# Patient Record
Sex: Female | Born: 1935 | Race: White | Hispanic: No | Smoking: Never smoker
Health system: Southern US, Community
[De-identification: ages and names within clinical notes are randomized; demographics above are authoritative.]

## PROBLEM LIST (undated history)

## (undated) DIAGNOSIS — E785 Hyperlipidemia, unspecified: Secondary | ICD-10-CM

## (undated) DIAGNOSIS — I10 Essential (primary) hypertension: Secondary | ICD-10-CM

## (undated) DIAGNOSIS — J449 Chronic obstructive pulmonary disease, unspecified: Secondary | ICD-10-CM

## (undated) DIAGNOSIS — F0390 Unspecified dementia without behavioral disturbance: Secondary | ICD-10-CM

---

## 2019-12-14 ENCOUNTER — Other Ambulatory Visit: Payer: Self-pay | Admitting: Internal Medicine

## 2019-12-14 ENCOUNTER — Other Ambulatory Visit: Payer: Self-pay

## 2019-12-14 DIAGNOSIS — R1312 Dysphagia, oropharyngeal phase: Secondary | ICD-10-CM

## 2019-12-28 ENCOUNTER — Ambulatory Visit
Admission: RE | Admit: 2019-12-28 | Discharge: 2019-12-28 | Disposition: A | Discharge status: Home / Self Care | Payer: Medicare Other | Source: Ambulatory Visit | Attending: Internal Medicine | Admitting: Internal Medicine

## 2019-12-28 ENCOUNTER — Other Ambulatory Visit: Payer: Self-pay

## 2019-12-28 DIAGNOSIS — R1312 Dysphagia, oropharyngeal phase: Secondary | ICD-10-CM | POA: Insufficient documentation

## 2019-12-28 NOTE — Therapy (Signed)
[place], [place], [address] Phone: [phone]   Fax:     Modified Barium Swallow  Patient Details  Name: Madeline Hernandez MRN: [id number] Date of Birth: [birth year] No data recorded  Encounter Date: 12/28/2019  End of Session - 12/28/19 1306    Visit Number  1    Number of Visits  1    Date for SLP Re-Evaluation  12/28/19       No past medical history on file.   There were no vitals filed for this visit.   Subjective: Patient behavior: (alertness, ability to follow instructions, etc.): The patient is alert and able to follow directions.   Chief complaint: Unknown- patient denies difficulty swallowing    Objective:  Radiological Procedure: A videoflouroscopic evaluation of oral-preparatory, reflex initiation, and pharyngeal phases of the swallow was performed; as well as a screening of the upper esophageal phase.  I. POSTURE: Upright in MBS chair  II. VIEW: Lateral  III. COMPENSATORY STRATEGIES: N/A  IV. BOLUSES ADMINISTERED:   Thin Liquid: 1 small, 3 rapid consecutive   Nectar-thick Liquid: 1 large   Honey-thick Liquid: DNT   Puree: 1 teaspoon, 1 heaping teaspoon   Mechanical Soft: 1/4 graham cracker in applesauce  V. RESULTS OF EVALUATION: A. ORAL PREPARATORY PHASE: (The lips, tongue, and velum are observed for strength and coordination)       **Overall Severity Rating: within normal limits  B. SWALLOW INITIATION/REFLEX: (The reflex is normal if "triggered" by the time the bolus reached the base of the tongue)  **Overall Severity Rating: Mild; triggers while falling from the valleculae to the pyriform sinuses  C. PHARYNGEAL PHASE: (Pharyngeal function is normal if the bolus shows rapid, smooth, and continuous transit through the pharynx and there is no pharyngeal residue after the swallow)  **Overall Severity Rating: within normal limits  D. LARYNGEAL PENETRATION: (Material  entering into the laryngeal inlet/vestibule but not aspirated) TRANSIENT laryngeal penetration of liquids (thin and nectar-thick) without laryngeal vestibule residue  E. ASPIRATION: None  F. ESOPHAGEAL PHASE: (Screening of the upper esophagus) In the cervical esophagus there is a finger-like protrusion along the posterior wall during swallow (does not impede flow of boluses) consistent with prominent cricopharyngeus.    ASSESSMENT: This 86 year old woman; sent for MBSS to determine oropharyngeal swallow function and rule out aspiration; is presenting with minimal oropharyngeal dysphagia characterized by delayed pharyngeal swallow initiation and transient laryngeal penetration. Oral control of the bolus including oral hold, rotary mastication, and anterior to posterior transfer is within functional limits.   Aspects of the pharyngeal stage of swallowing including tongue base retraction, hyolaryngeal excursion, epiglottic inversion, and duration/amplitude of UES opening are within normal limits.  There is no observed pharyngeal residue or tracheal aspiration.  The patient is not at risk for prandial aspiration.  In the cervical esophagus there is a finger-like protrusion along the posterior wall during swallow (does not impede flow of boluses) consistent with prominent cricopharyngeus.    PLAN/RECOMMENDATIONS:   A. Diet: Regular   B. Swallowing Precautions: Standard, no special precautions indicated   C. Recommended consultation to: GI   D. Therapy recommendations: speech therapy is not indicated   E. Results and recommendations were discussed with the patient immediately following the study and the final report routed to the referring practitioner      Oropharyngeal dysphagia - Plan: DG SWALLOW FUNC OP MEDICARE SPEECH PATH, [place]  Problem List There are no problems to display for this patient.  Leroy Sea, [place],  Susie 12/28/2019, 1:06 PM  [facility] [place], [place], [address] Phone: [phone]   Fax:     Name: Madeline Hernandez MRN: [id number] Date of Birth: 08-Apr-1933

## 2020-04-24 DIAGNOSIS — E559 Vitamin D deficiency, unspecified: Secondary | ICD-10-CM | POA: Diagnosis not present

## 2020-04-24 DIAGNOSIS — E038 Other specified hypothyroidism: Secondary | ICD-10-CM | POA: Diagnosis not present

## 2020-04-24 DIAGNOSIS — E119 Type 2 diabetes mellitus without complications: Secondary | ICD-10-CM | POA: Diagnosis not present

## 2020-04-24 DIAGNOSIS — D518 Other vitamin B12 deficiency anemias: Secondary | ICD-10-CM | POA: Diagnosis not present

## 2020-04-24 DIAGNOSIS — J449 Chronic obstructive pulmonary disease, unspecified: Secondary | ICD-10-CM | POA: Diagnosis not present

## 2020-05-04 DIAGNOSIS — B351 Tinea unguium: Secondary | ICD-10-CM | POA: Diagnosis not present

## 2020-05-14 DIAGNOSIS — J449 Chronic obstructive pulmonary disease, unspecified: Secondary | ICD-10-CM | POA: Diagnosis not present

## 2020-05-14 DIAGNOSIS — E039 Hypothyroidism, unspecified: Secondary | ICD-10-CM | POA: Diagnosis not present

## 2020-05-14 DIAGNOSIS — E785 Hyperlipidemia, unspecified: Secondary | ICD-10-CM | POA: Diagnosis not present

## 2020-05-14 DIAGNOSIS — I10 Essential (primary) hypertension: Secondary | ICD-10-CM | POA: Diagnosis not present

## 2020-05-18 DIAGNOSIS — E038 Other specified hypothyroidism: Secondary | ICD-10-CM | POA: Diagnosis not present

## 2020-05-18 DIAGNOSIS — D518 Other vitamin B12 deficiency anemias: Secondary | ICD-10-CM | POA: Diagnosis not present

## 2020-05-18 DIAGNOSIS — J449 Chronic obstructive pulmonary disease, unspecified: Secondary | ICD-10-CM | POA: Diagnosis not present

## 2020-05-18 DIAGNOSIS — E559 Vitamin D deficiency, unspecified: Secondary | ICD-10-CM | POA: Diagnosis not present

## 2020-05-18 DIAGNOSIS — E119 Type 2 diabetes mellitus without complications: Secondary | ICD-10-CM | POA: Diagnosis not present

## 2020-06-13 DIAGNOSIS — R590 Localized enlarged lymph nodes: Secondary | ICD-10-CM | POA: Diagnosis not present

## 2020-06-13 DIAGNOSIS — R0989 Other specified symptoms and signs involving the circulatory and respiratory systems: Secondary | ICD-10-CM | POA: Diagnosis not present

## 2020-06-13 DIAGNOSIS — I6523 Occlusion and stenosis of bilateral carotid arteries: Secondary | ICD-10-CM | POA: Diagnosis not present

## 2020-06-13 DIAGNOSIS — D492 Neoplasm of unspecified behavior of bone, soft tissue, and skin: Secondary | ICD-10-CM | POA: Diagnosis not present

## 2020-06-13 DIAGNOSIS — R229 Localized swelling, mass and lump, unspecified: Secondary | ICD-10-CM | POA: Diagnosis not present

## 2020-06-14 DIAGNOSIS — E785 Hyperlipidemia, unspecified: Secondary | ICD-10-CM | POA: Diagnosis not present

## 2020-06-14 DIAGNOSIS — J449 Chronic obstructive pulmonary disease, unspecified: Secondary | ICD-10-CM | POA: Diagnosis not present

## 2020-06-14 DIAGNOSIS — I10 Essential (primary) hypertension: Secondary | ICD-10-CM | POA: Diagnosis not present

## 2020-06-14 DIAGNOSIS — E039 Hypothyroidism, unspecified: Secondary | ICD-10-CM | POA: Diagnosis not present

## 2020-06-14 DIAGNOSIS — G4700 Insomnia, unspecified: Secondary | ICD-10-CM | POA: Diagnosis not present

## 2020-06-15 DIAGNOSIS — E042 Nontoxic multinodular goiter: Secondary | ICD-10-CM | POA: Diagnosis not present

## 2020-06-15 DIAGNOSIS — I700 Atherosclerosis of aorta: Secondary | ICD-10-CM | POA: Diagnosis not present

## 2020-06-15 DIAGNOSIS — R221 Localized swelling, mass and lump, neck: Secondary | ICD-10-CM | POA: Diagnosis not present

## 2020-06-15 DIAGNOSIS — I714 Abdominal aortic aneurysm, without rupture: Secondary | ICD-10-CM | POA: Diagnosis not present

## 2020-06-18 DIAGNOSIS — I70229 Atherosclerosis of native arteries of extremities with rest pain, unspecified extremity: Secondary | ICD-10-CM | POA: Diagnosis not present

## 2020-06-18 DIAGNOSIS — I739 Peripheral vascular disease, unspecified: Secondary | ICD-10-CM | POA: Diagnosis not present

## 2020-06-19 DIAGNOSIS — R011 Cardiac murmur, unspecified: Secondary | ICD-10-CM | POA: Diagnosis not present

## 2020-06-26 DIAGNOSIS — D518 Other vitamin B12 deficiency anemias: Secondary | ICD-10-CM | POA: Diagnosis not present

## 2020-06-26 DIAGNOSIS — E559 Vitamin D deficiency, unspecified: Secondary | ICD-10-CM | POA: Diagnosis not present

## 2020-06-26 DIAGNOSIS — E038 Other specified hypothyroidism: Secondary | ICD-10-CM | POA: Diagnosis not present

## 2020-06-26 DIAGNOSIS — E119 Type 2 diabetes mellitus without complications: Secondary | ICD-10-CM | POA: Diagnosis not present

## 2020-06-26 DIAGNOSIS — J449 Chronic obstructive pulmonary disease, unspecified: Secondary | ICD-10-CM | POA: Diagnosis not present

## 2020-07-03 DIAGNOSIS — Z79899 Other long term (current) drug therapy: Secondary | ICD-10-CM | POA: Diagnosis not present

## 2020-07-03 DIAGNOSIS — D518 Other vitamin B12 deficiency anemias: Secondary | ICD-10-CM | POA: Diagnosis not present

## 2020-07-03 DIAGNOSIS — E559 Vitamin D deficiency, unspecified: Secondary | ICD-10-CM | POA: Diagnosis not present

## 2020-07-03 DIAGNOSIS — E038 Other specified hypothyroidism: Secondary | ICD-10-CM | POA: Diagnosis not present

## 2020-07-03 DIAGNOSIS — E119 Type 2 diabetes mellitus without complications: Secondary | ICD-10-CM | POA: Diagnosis not present

## 2020-07-18 DIAGNOSIS — U071 COVID-19: Secondary | ICD-10-CM | POA: Diagnosis not present

## 2020-07-19 DIAGNOSIS — U071 COVID-19: Secondary | ICD-10-CM | POA: Diagnosis not present

## 2020-07-19 DIAGNOSIS — I10 Essential (primary) hypertension: Secondary | ICD-10-CM | POA: Diagnosis not present

## 2020-07-19 DIAGNOSIS — E039 Hypothyroidism, unspecified: Secondary | ICD-10-CM | POA: Diagnosis not present

## 2020-07-19 DIAGNOSIS — J449 Chronic obstructive pulmonary disease, unspecified: Secondary | ICD-10-CM | POA: Diagnosis not present

## 2020-07-26 DIAGNOSIS — J449 Chronic obstructive pulmonary disease, unspecified: Secondary | ICD-10-CM | POA: Diagnosis not present

## 2020-07-26 DIAGNOSIS — E119 Type 2 diabetes mellitus without complications: Secondary | ICD-10-CM | POA: Diagnosis not present

## 2020-07-26 DIAGNOSIS — E038 Other specified hypothyroidism: Secondary | ICD-10-CM | POA: Diagnosis not present

## 2020-07-26 DIAGNOSIS — E559 Vitamin D deficiency, unspecified: Secondary | ICD-10-CM | POA: Diagnosis not present

## 2020-07-26 DIAGNOSIS — D518 Other vitamin B12 deficiency anemias: Secondary | ICD-10-CM | POA: Diagnosis not present

## 2020-07-27 DIAGNOSIS — B351 Tinea unguium: Secondary | ICD-10-CM | POA: Diagnosis not present

## 2020-08-16 DIAGNOSIS — U071 COVID-19: Secondary | ICD-10-CM | POA: Diagnosis not present

## 2020-08-23 DIAGNOSIS — G4700 Insomnia, unspecified: Secondary | ICD-10-CM | POA: Diagnosis not present

## 2020-08-23 DIAGNOSIS — J449 Chronic obstructive pulmonary disease, unspecified: Secondary | ICD-10-CM | POA: Diagnosis not present

## 2020-08-23 DIAGNOSIS — E039 Hypothyroidism, unspecified: Secondary | ICD-10-CM | POA: Diagnosis not present

## 2020-08-23 DIAGNOSIS — I10 Essential (primary) hypertension: Secondary | ICD-10-CM | POA: Diagnosis not present

## 2020-08-23 DIAGNOSIS — E785 Hyperlipidemia, unspecified: Secondary | ICD-10-CM | POA: Diagnosis not present

## 2020-08-25 DIAGNOSIS — R059 Cough, unspecified: Secondary | ICD-10-CM | POA: Diagnosis not present

## 2020-08-26 DIAGNOSIS — E039 Hypothyroidism, unspecified: Secondary | ICD-10-CM | POA: Diagnosis not present

## 2020-08-26 DIAGNOSIS — E119 Type 2 diabetes mellitus without complications: Secondary | ICD-10-CM | POA: Diagnosis not present

## 2020-08-26 DIAGNOSIS — E785 Hyperlipidemia, unspecified: Secondary | ICD-10-CM | POA: Diagnosis not present

## 2020-08-26 DIAGNOSIS — J449 Chronic obstructive pulmonary disease, unspecified: Secondary | ICD-10-CM | POA: Diagnosis not present

## 2020-08-26 DIAGNOSIS — E038 Other specified hypothyroidism: Secondary | ICD-10-CM | POA: Diagnosis not present

## 2020-08-26 DIAGNOSIS — D518 Other vitamin B12 deficiency anemias: Secondary | ICD-10-CM | POA: Diagnosis not present

## 2020-08-26 DIAGNOSIS — E559 Vitamin D deficiency, unspecified: Secondary | ICD-10-CM | POA: Diagnosis not present

## 2020-08-26 DIAGNOSIS — I10 Essential (primary) hypertension: Secondary | ICD-10-CM | POA: Diagnosis not present

## 2020-09-20 DIAGNOSIS — E039 Hypothyroidism, unspecified: Secondary | ICD-10-CM | POA: Diagnosis not present

## 2020-09-20 DIAGNOSIS — E785 Hyperlipidemia, unspecified: Secondary | ICD-10-CM | POA: Diagnosis not present

## 2020-09-20 DIAGNOSIS — G4700 Insomnia, unspecified: Secondary | ICD-10-CM | POA: Diagnosis not present

## 2020-09-20 DIAGNOSIS — J449 Chronic obstructive pulmonary disease, unspecified: Secondary | ICD-10-CM | POA: Diagnosis not present

## 2020-09-20 DIAGNOSIS — I10 Essential (primary) hypertension: Secondary | ICD-10-CM | POA: Diagnosis not present

## 2020-09-24 DIAGNOSIS — E038 Other specified hypothyroidism: Secondary | ICD-10-CM | POA: Diagnosis not present

## 2020-09-24 DIAGNOSIS — J449 Chronic obstructive pulmonary disease, unspecified: Secondary | ICD-10-CM | POA: Diagnosis not present

## 2020-09-24 DIAGNOSIS — D518 Other vitamin B12 deficiency anemias: Secondary | ICD-10-CM | POA: Diagnosis not present

## 2020-09-24 DIAGNOSIS — E785 Hyperlipidemia, unspecified: Secondary | ICD-10-CM | POA: Diagnosis not present

## 2020-09-24 DIAGNOSIS — E039 Hypothyroidism, unspecified: Secondary | ICD-10-CM | POA: Diagnosis not present

## 2020-09-24 DIAGNOSIS — E559 Vitamin D deficiency, unspecified: Secondary | ICD-10-CM | POA: Diagnosis not present

## 2020-09-24 DIAGNOSIS — I10 Essential (primary) hypertension: Secondary | ICD-10-CM | POA: Diagnosis not present

## 2020-09-24 DIAGNOSIS — E119 Type 2 diabetes mellitus without complications: Secondary | ICD-10-CM | POA: Diagnosis not present

## 2020-10-02 DIAGNOSIS — Z79899 Other long term (current) drug therapy: Secondary | ICD-10-CM | POA: Diagnosis not present

## 2020-10-02 DIAGNOSIS — E038 Other specified hypothyroidism: Secondary | ICD-10-CM | POA: Diagnosis not present

## 2020-10-02 DIAGNOSIS — E7849 Other hyperlipidemia: Secondary | ICD-10-CM | POA: Diagnosis not present

## 2020-10-02 DIAGNOSIS — D518 Other vitamin B12 deficiency anemias: Secondary | ICD-10-CM | POA: Diagnosis not present

## 2020-10-02 DIAGNOSIS — E119 Type 2 diabetes mellitus without complications: Secondary | ICD-10-CM | POA: Diagnosis not present

## 2020-10-02 DIAGNOSIS — E612 Magnesium deficiency: Secondary | ICD-10-CM | POA: Diagnosis not present

## 2020-10-02 DIAGNOSIS — E559 Vitamin D deficiency, unspecified: Secondary | ICD-10-CM | POA: Diagnosis not present

## 2020-10-18 DIAGNOSIS — E039 Hypothyroidism, unspecified: Secondary | ICD-10-CM | POA: Diagnosis not present

## 2020-10-18 DIAGNOSIS — E559 Vitamin D deficiency, unspecified: Secondary | ICD-10-CM | POA: Diagnosis not present

## 2020-10-18 DIAGNOSIS — J449 Chronic obstructive pulmonary disease, unspecified: Secondary | ICD-10-CM | POA: Diagnosis not present

## 2020-10-18 DIAGNOSIS — I10 Essential (primary) hypertension: Secondary | ICD-10-CM | POA: Diagnosis not present

## 2020-10-18 DIAGNOSIS — J309 Allergic rhinitis, unspecified: Secondary | ICD-10-CM | POA: Diagnosis not present

## 2020-10-21 DIAGNOSIS — E039 Hypothyroidism, unspecified: Secondary | ICD-10-CM | POA: Diagnosis not present

## 2020-10-21 DIAGNOSIS — E559 Vitamin D deficiency, unspecified: Secondary | ICD-10-CM | POA: Diagnosis not present

## 2020-10-21 DIAGNOSIS — J449 Chronic obstructive pulmonary disease, unspecified: Secondary | ICD-10-CM | POA: Diagnosis not present

## 2020-10-21 DIAGNOSIS — I10 Essential (primary) hypertension: Secondary | ICD-10-CM | POA: Diagnosis not present

## 2020-10-21 DIAGNOSIS — E119 Type 2 diabetes mellitus without complications: Secondary | ICD-10-CM | POA: Diagnosis not present

## 2020-10-21 DIAGNOSIS — D518 Other vitamin B12 deficiency anemias: Secondary | ICD-10-CM | POA: Diagnosis not present

## 2020-10-21 DIAGNOSIS — E038 Other specified hypothyroidism: Secondary | ICD-10-CM | POA: Diagnosis not present

## 2020-10-21 DIAGNOSIS — E785 Hyperlipidemia, unspecified: Secondary | ICD-10-CM | POA: Diagnosis not present

## 2020-11-06 DIAGNOSIS — R262 Difficulty in walking, not elsewhere classified: Secondary | ICD-10-CM | POA: Diagnosis not present

## 2020-11-06 DIAGNOSIS — J309 Allergic rhinitis, unspecified: Secondary | ICD-10-CM | POA: Diagnosis not present

## 2020-11-06 DIAGNOSIS — E039 Hypothyroidism, unspecified: Secondary | ICD-10-CM | POA: Diagnosis not present

## 2020-11-06 DIAGNOSIS — F32A Depression, unspecified: Secondary | ICD-10-CM | POA: Diagnosis not present

## 2020-11-06 DIAGNOSIS — R5381 Other malaise: Secondary | ICD-10-CM | POA: Diagnosis not present

## 2020-11-06 DIAGNOSIS — G4700 Insomnia, unspecified: Secondary | ICD-10-CM | POA: Diagnosis not present

## 2020-11-06 DIAGNOSIS — J449 Chronic obstructive pulmonary disease, unspecified: Secondary | ICD-10-CM | POA: Diagnosis not present

## 2020-11-06 DIAGNOSIS — I10 Essential (primary) hypertension: Secondary | ICD-10-CM | POA: Diagnosis not present

## 2020-11-06 DIAGNOSIS — E785 Hyperlipidemia, unspecified: Secondary | ICD-10-CM | POA: Diagnosis not present

## 2020-11-06 DIAGNOSIS — R42 Dizziness and giddiness: Secondary | ICD-10-CM | POA: Diagnosis not present

## 2020-11-08 DIAGNOSIS — R42 Dizziness and giddiness: Secondary | ICD-10-CM | POA: Diagnosis not present

## 2020-11-08 DIAGNOSIS — R262 Difficulty in walking, not elsewhere classified: Secondary | ICD-10-CM | POA: Diagnosis not present

## 2020-11-08 DIAGNOSIS — I10 Essential (primary) hypertension: Secondary | ICD-10-CM | POA: Diagnosis not present

## 2020-11-08 DIAGNOSIS — I69318 Other symptoms and signs involving cognitive functions following cerebral infarction: Secondary | ICD-10-CM | POA: Diagnosis not present

## 2020-11-08 DIAGNOSIS — E785 Hyperlipidemia, unspecified: Secondary | ICD-10-CM | POA: Diagnosis not present

## 2020-11-08 DIAGNOSIS — J449 Chronic obstructive pulmonary disease, unspecified: Secondary | ICD-10-CM | POA: Diagnosis not present

## 2020-11-08 DIAGNOSIS — R531 Weakness: Secondary | ICD-10-CM | POA: Diagnosis not present

## 2020-11-08 DIAGNOSIS — E039 Hypothyroidism, unspecified: Secondary | ICD-10-CM | POA: Diagnosis not present

## 2020-11-08 DIAGNOSIS — M810 Age-related osteoporosis without current pathological fracture: Secondary | ICD-10-CM | POA: Diagnosis not present

## 2020-11-14 DIAGNOSIS — I69318 Other symptoms and signs involving cognitive functions following cerebral infarction: Secondary | ICD-10-CM | POA: Diagnosis not present

## 2020-11-14 DIAGNOSIS — I10 Essential (primary) hypertension: Secondary | ICD-10-CM | POA: Diagnosis not present

## 2020-11-14 DIAGNOSIS — R262 Difficulty in walking, not elsewhere classified: Secondary | ICD-10-CM | POA: Diagnosis not present

## 2020-11-14 DIAGNOSIS — E785 Hyperlipidemia, unspecified: Secondary | ICD-10-CM | POA: Diagnosis not present

## 2020-11-14 DIAGNOSIS — E039 Hypothyroidism, unspecified: Secondary | ICD-10-CM | POA: Diagnosis not present

## 2020-11-14 DIAGNOSIS — R531 Weakness: Secondary | ICD-10-CM | POA: Diagnosis not present

## 2020-11-14 DIAGNOSIS — J449 Chronic obstructive pulmonary disease, unspecified: Secondary | ICD-10-CM | POA: Diagnosis not present

## 2020-11-14 DIAGNOSIS — M810 Age-related osteoporosis without current pathological fracture: Secondary | ICD-10-CM | POA: Diagnosis not present

## 2020-11-14 DIAGNOSIS — R42 Dizziness and giddiness: Secondary | ICD-10-CM | POA: Diagnosis not present

## 2020-11-15 DIAGNOSIS — E039 Hypothyroidism, unspecified: Secondary | ICD-10-CM | POA: Diagnosis not present

## 2020-11-15 DIAGNOSIS — R42 Dizziness and giddiness: Secondary | ICD-10-CM | POA: Diagnosis not present

## 2020-11-15 DIAGNOSIS — R262 Difficulty in walking, not elsewhere classified: Secondary | ICD-10-CM | POA: Diagnosis not present

## 2020-11-15 DIAGNOSIS — R531 Weakness: Secondary | ICD-10-CM | POA: Diagnosis not present

## 2020-11-15 DIAGNOSIS — J449 Chronic obstructive pulmonary disease, unspecified: Secondary | ICD-10-CM | POA: Diagnosis not present

## 2020-11-15 DIAGNOSIS — I69318 Other symptoms and signs involving cognitive functions following cerebral infarction: Secondary | ICD-10-CM | POA: Diagnosis not present

## 2020-11-15 DIAGNOSIS — M810 Age-related osteoporosis without current pathological fracture: Secondary | ICD-10-CM | POA: Diagnosis not present

## 2020-11-15 DIAGNOSIS — I10 Essential (primary) hypertension: Secondary | ICD-10-CM | POA: Diagnosis not present

## 2020-11-15 DIAGNOSIS — E785 Hyperlipidemia, unspecified: Secondary | ICD-10-CM | POA: Diagnosis not present

## 2020-11-16 DIAGNOSIS — I10 Essential (primary) hypertension: Secondary | ICD-10-CM | POA: Diagnosis not present

## 2020-11-16 DIAGNOSIS — E039 Hypothyroidism, unspecified: Secondary | ICD-10-CM | POA: Diagnosis not present

## 2020-11-16 DIAGNOSIS — I69318 Other symptoms and signs involving cognitive functions following cerebral infarction: Secondary | ICD-10-CM | POA: Diagnosis not present

## 2020-11-16 DIAGNOSIS — E785 Hyperlipidemia, unspecified: Secondary | ICD-10-CM | POA: Diagnosis not present

## 2020-11-16 DIAGNOSIS — J449 Chronic obstructive pulmonary disease, unspecified: Secondary | ICD-10-CM | POA: Diagnosis not present

## 2020-11-16 DIAGNOSIS — M810 Age-related osteoporosis without current pathological fracture: Secondary | ICD-10-CM | POA: Diagnosis not present

## 2020-11-16 DIAGNOSIS — R42 Dizziness and giddiness: Secondary | ICD-10-CM | POA: Diagnosis not present

## 2020-11-16 DIAGNOSIS — R531 Weakness: Secondary | ICD-10-CM | POA: Diagnosis not present

## 2020-11-16 DIAGNOSIS — R262 Difficulty in walking, not elsewhere classified: Secondary | ICD-10-CM | POA: Diagnosis not present

## 2020-11-19 DIAGNOSIS — E785 Hyperlipidemia, unspecified: Secondary | ICD-10-CM | POA: Diagnosis not present

## 2020-11-19 DIAGNOSIS — R42 Dizziness and giddiness: Secondary | ICD-10-CM | POA: Diagnosis not present

## 2020-11-19 DIAGNOSIS — I69318 Other symptoms and signs involving cognitive functions following cerebral infarction: Secondary | ICD-10-CM | POA: Diagnosis not present

## 2020-11-19 DIAGNOSIS — M810 Age-related osteoporosis without current pathological fracture: Secondary | ICD-10-CM | POA: Diagnosis not present

## 2020-11-19 DIAGNOSIS — R531 Weakness: Secondary | ICD-10-CM | POA: Diagnosis not present

## 2020-11-19 DIAGNOSIS — R262 Difficulty in walking, not elsewhere classified: Secondary | ICD-10-CM | POA: Diagnosis not present

## 2020-11-19 DIAGNOSIS — I10 Essential (primary) hypertension: Secondary | ICD-10-CM | POA: Diagnosis not present

## 2020-11-19 DIAGNOSIS — E039 Hypothyroidism, unspecified: Secondary | ICD-10-CM | POA: Diagnosis not present

## 2020-11-19 DIAGNOSIS — J449 Chronic obstructive pulmonary disease, unspecified: Secondary | ICD-10-CM | POA: Diagnosis not present

## 2020-11-21 DIAGNOSIS — R531 Weakness: Secondary | ICD-10-CM | POA: Diagnosis not present

## 2020-11-21 DIAGNOSIS — I10 Essential (primary) hypertension: Secondary | ICD-10-CM | POA: Diagnosis not present

## 2020-11-21 DIAGNOSIS — E039 Hypothyroidism, unspecified: Secondary | ICD-10-CM | POA: Diagnosis not present

## 2020-11-21 DIAGNOSIS — E785 Hyperlipidemia, unspecified: Secondary | ICD-10-CM | POA: Diagnosis not present

## 2020-11-21 DIAGNOSIS — J449 Chronic obstructive pulmonary disease, unspecified: Secondary | ICD-10-CM | POA: Diagnosis not present

## 2020-11-21 DIAGNOSIS — I69318 Other symptoms and signs involving cognitive functions following cerebral infarction: Secondary | ICD-10-CM | POA: Diagnosis not present

## 2020-11-21 DIAGNOSIS — R262 Difficulty in walking, not elsewhere classified: Secondary | ICD-10-CM | POA: Diagnosis not present

## 2020-11-21 DIAGNOSIS — M810 Age-related osteoporosis without current pathological fracture: Secondary | ICD-10-CM | POA: Diagnosis not present

## 2020-11-21 DIAGNOSIS — R42 Dizziness and giddiness: Secondary | ICD-10-CM | POA: Diagnosis not present

## 2020-11-22 DIAGNOSIS — I69318 Other symptoms and signs involving cognitive functions following cerebral infarction: Secondary | ICD-10-CM | POA: Diagnosis not present

## 2020-11-22 DIAGNOSIS — R262 Difficulty in walking, not elsewhere classified: Secondary | ICD-10-CM | POA: Diagnosis not present

## 2020-11-22 DIAGNOSIS — M810 Age-related osteoporosis without current pathological fracture: Secondary | ICD-10-CM | POA: Diagnosis not present

## 2020-11-22 DIAGNOSIS — R42 Dizziness and giddiness: Secondary | ICD-10-CM | POA: Diagnosis not present

## 2020-11-22 DIAGNOSIS — E039 Hypothyroidism, unspecified: Secondary | ICD-10-CM | POA: Diagnosis not present

## 2020-11-22 DIAGNOSIS — E785 Hyperlipidemia, unspecified: Secondary | ICD-10-CM | POA: Diagnosis not present

## 2020-11-22 DIAGNOSIS — R531 Weakness: Secondary | ICD-10-CM | POA: Diagnosis not present

## 2020-11-22 DIAGNOSIS — J449 Chronic obstructive pulmonary disease, unspecified: Secondary | ICD-10-CM | POA: Diagnosis not present

## 2020-11-22 DIAGNOSIS — I10 Essential (primary) hypertension: Secondary | ICD-10-CM | POA: Diagnosis not present

## 2020-11-28 DIAGNOSIS — I69318 Other symptoms and signs involving cognitive functions following cerebral infarction: Secondary | ICD-10-CM | POA: Diagnosis not present

## 2020-11-28 DIAGNOSIS — R42 Dizziness and giddiness: Secondary | ICD-10-CM | POA: Diagnosis not present

## 2020-11-28 DIAGNOSIS — E785 Hyperlipidemia, unspecified: Secondary | ICD-10-CM | POA: Diagnosis not present

## 2020-11-28 DIAGNOSIS — E039 Hypothyroidism, unspecified: Secondary | ICD-10-CM | POA: Diagnosis not present

## 2020-11-28 DIAGNOSIS — E038 Other specified hypothyroidism: Secondary | ICD-10-CM | POA: Diagnosis not present

## 2020-11-28 DIAGNOSIS — M810 Age-related osteoporosis without current pathological fracture: Secondary | ICD-10-CM | POA: Diagnosis not present

## 2020-11-28 DIAGNOSIS — I10 Essential (primary) hypertension: Secondary | ICD-10-CM | POA: Diagnosis not present

## 2020-11-28 DIAGNOSIS — J449 Chronic obstructive pulmonary disease, unspecified: Secondary | ICD-10-CM | POA: Diagnosis not present

## 2020-11-28 DIAGNOSIS — R531 Weakness: Secondary | ICD-10-CM | POA: Diagnosis not present

## 2020-11-28 DIAGNOSIS — R262 Difficulty in walking, not elsewhere classified: Secondary | ICD-10-CM | POA: Diagnosis not present

## 2020-11-28 DIAGNOSIS — E119 Type 2 diabetes mellitus without complications: Secondary | ICD-10-CM | POA: Diagnosis not present

## 2020-11-28 DIAGNOSIS — E559 Vitamin D deficiency, unspecified: Secondary | ICD-10-CM | POA: Diagnosis not present

## 2020-11-28 DIAGNOSIS — D518 Other vitamin B12 deficiency anemias: Secondary | ICD-10-CM | POA: Diagnosis not present

## 2020-12-04 DIAGNOSIS — J309 Allergic rhinitis, unspecified: Secondary | ICD-10-CM | POA: Diagnosis not present

## 2020-12-04 DIAGNOSIS — R262 Difficulty in walking, not elsewhere classified: Secondary | ICD-10-CM | POA: Diagnosis not present

## 2020-12-04 DIAGNOSIS — E559 Vitamin D deficiency, unspecified: Secondary | ICD-10-CM | POA: Diagnosis not present

## 2020-12-04 DIAGNOSIS — E785 Hyperlipidemia, unspecified: Secondary | ICD-10-CM | POA: Diagnosis not present

## 2020-12-04 DIAGNOSIS — I10 Essential (primary) hypertension: Secondary | ICD-10-CM | POA: Diagnosis not present

## 2020-12-04 DIAGNOSIS — R5381 Other malaise: Secondary | ICD-10-CM | POA: Diagnosis not present

## 2020-12-04 DIAGNOSIS — J449 Chronic obstructive pulmonary disease, unspecified: Secondary | ICD-10-CM | POA: Diagnosis not present

## 2020-12-04 DIAGNOSIS — E039 Hypothyroidism, unspecified: Secondary | ICD-10-CM | POA: Diagnosis not present

## 2020-12-04 DIAGNOSIS — R42 Dizziness and giddiness: Secondary | ICD-10-CM | POA: Diagnosis not present

## 2020-12-04 DIAGNOSIS — G4700 Insomnia, unspecified: Secondary | ICD-10-CM | POA: Diagnosis not present

## 2020-12-24 DIAGNOSIS — E038 Other specified hypothyroidism: Secondary | ICD-10-CM | POA: Diagnosis not present

## 2020-12-24 DIAGNOSIS — D518 Other vitamin B12 deficiency anemias: Secondary | ICD-10-CM | POA: Diagnosis not present

## 2020-12-24 DIAGNOSIS — E559 Vitamin D deficiency, unspecified: Secondary | ICD-10-CM | POA: Diagnosis not present

## 2020-12-24 DIAGNOSIS — E119 Type 2 diabetes mellitus without complications: Secondary | ICD-10-CM | POA: Diagnosis not present

## 2020-12-24 DIAGNOSIS — I10 Essential (primary) hypertension: Secondary | ICD-10-CM | POA: Diagnosis not present

## 2020-12-24 DIAGNOSIS — E785 Hyperlipidemia, unspecified: Secondary | ICD-10-CM | POA: Diagnosis not present

## 2020-12-24 DIAGNOSIS — E039 Hypothyroidism, unspecified: Secondary | ICD-10-CM | POA: Diagnosis not present

## 2020-12-24 DIAGNOSIS — J449 Chronic obstructive pulmonary disease, unspecified: Secondary | ICD-10-CM | POA: Diagnosis not present

## 2021-01-08 DIAGNOSIS — I10 Essential (primary) hypertension: Secondary | ICD-10-CM | POA: Diagnosis not present

## 2021-01-08 DIAGNOSIS — J449 Chronic obstructive pulmonary disease, unspecified: Secondary | ICD-10-CM | POA: Diagnosis not present

## 2021-01-08 DIAGNOSIS — E785 Hyperlipidemia, unspecified: Secondary | ICD-10-CM | POA: Diagnosis not present

## 2021-01-08 DIAGNOSIS — R5381 Other malaise: Secondary | ICD-10-CM | POA: Diagnosis not present

## 2021-01-08 DIAGNOSIS — E039 Hypothyroidism, unspecified: Secondary | ICD-10-CM | POA: Diagnosis not present

## 2021-01-08 DIAGNOSIS — R262 Difficulty in walking, not elsewhere classified: Secondary | ICD-10-CM | POA: Diagnosis not present

## 2021-01-08 DIAGNOSIS — G4700 Insomnia, unspecified: Secondary | ICD-10-CM | POA: Diagnosis not present

## 2021-01-09 DIAGNOSIS — N390 Urinary tract infection, site not specified: Secondary | ICD-10-CM | POA: Diagnosis not present

## 2021-01-15 DIAGNOSIS — I10 Essential (primary) hypertension: Secondary | ICD-10-CM | POA: Diagnosis not present

## 2021-01-15 DIAGNOSIS — J449 Chronic obstructive pulmonary disease, unspecified: Secondary | ICD-10-CM | POA: Diagnosis not present

## 2021-01-15 DIAGNOSIS — E559 Vitamin D deficiency, unspecified: Secondary | ICD-10-CM | POA: Diagnosis not present

## 2021-01-15 DIAGNOSIS — F32A Depression, unspecified: Secondary | ICD-10-CM | POA: Diagnosis not present

## 2021-01-15 DIAGNOSIS — G4700 Insomnia, unspecified: Secondary | ICD-10-CM | POA: Diagnosis not present

## 2021-01-15 DIAGNOSIS — E785 Hyperlipidemia, unspecified: Secondary | ICD-10-CM | POA: Diagnosis not present

## 2021-01-22 DIAGNOSIS — F32A Depression, unspecified: Secondary | ICD-10-CM | POA: Diagnosis not present

## 2021-01-22 DIAGNOSIS — J449 Chronic obstructive pulmonary disease, unspecified: Secondary | ICD-10-CM | POA: Diagnosis not present

## 2021-01-22 DIAGNOSIS — E785 Hyperlipidemia, unspecified: Secondary | ICD-10-CM | POA: Diagnosis not present

## 2021-01-22 DIAGNOSIS — I69318 Other symptoms and signs involving cognitive functions following cerebral infarction: Secondary | ICD-10-CM | POA: Diagnosis not present

## 2021-01-22 DIAGNOSIS — I10 Essential (primary) hypertension: Secondary | ICD-10-CM | POA: Diagnosis not present

## 2021-01-22 DIAGNOSIS — E039 Hypothyroidism, unspecified: Secondary | ICD-10-CM | POA: Diagnosis not present

## 2021-01-22 DIAGNOSIS — M6281 Muscle weakness (generalized): Secondary | ICD-10-CM | POA: Diagnosis not present

## 2021-01-25 DIAGNOSIS — I69318 Other symptoms and signs involving cognitive functions following cerebral infarction: Secondary | ICD-10-CM | POA: Diagnosis not present

## 2021-01-25 DIAGNOSIS — E038 Other specified hypothyroidism: Secondary | ICD-10-CM | POA: Diagnosis not present

## 2021-01-25 DIAGNOSIS — E785 Hyperlipidemia, unspecified: Secondary | ICD-10-CM | POA: Diagnosis not present

## 2021-01-25 DIAGNOSIS — F32A Depression, unspecified: Secondary | ICD-10-CM | POA: Diagnosis not present

## 2021-01-25 DIAGNOSIS — E119 Type 2 diabetes mellitus without complications: Secondary | ICD-10-CM | POA: Diagnosis not present

## 2021-01-25 DIAGNOSIS — I10 Essential (primary) hypertension: Secondary | ICD-10-CM | POA: Diagnosis not present

## 2021-01-25 DIAGNOSIS — J449 Chronic obstructive pulmonary disease, unspecified: Secondary | ICD-10-CM | POA: Diagnosis not present

## 2021-01-25 DIAGNOSIS — E559 Vitamin D deficiency, unspecified: Secondary | ICD-10-CM | POA: Diagnosis not present

## 2021-01-25 DIAGNOSIS — D518 Other vitamin B12 deficiency anemias: Secondary | ICD-10-CM | POA: Diagnosis not present

## 2021-01-25 DIAGNOSIS — M6281 Muscle weakness (generalized): Secondary | ICD-10-CM | POA: Diagnosis not present

## 2021-01-25 DIAGNOSIS — E039 Hypothyroidism, unspecified: Secondary | ICD-10-CM | POA: Diagnosis not present

## 2021-01-28 DIAGNOSIS — I69318 Other symptoms and signs involving cognitive functions following cerebral infarction: Secondary | ICD-10-CM | POA: Diagnosis not present

## 2021-01-28 DIAGNOSIS — J449 Chronic obstructive pulmonary disease, unspecified: Secondary | ICD-10-CM | POA: Diagnosis not present

## 2021-01-28 DIAGNOSIS — I10 Essential (primary) hypertension: Secondary | ICD-10-CM | POA: Diagnosis not present

## 2021-01-28 DIAGNOSIS — E785 Hyperlipidemia, unspecified: Secondary | ICD-10-CM | POA: Diagnosis not present

## 2021-01-28 DIAGNOSIS — E039 Hypothyroidism, unspecified: Secondary | ICD-10-CM | POA: Diagnosis not present

## 2021-01-28 DIAGNOSIS — F32A Depression, unspecified: Secondary | ICD-10-CM | POA: Diagnosis not present

## 2021-01-28 DIAGNOSIS — M6281 Muscle weakness (generalized): Secondary | ICD-10-CM | POA: Diagnosis not present

## 2021-01-31 DIAGNOSIS — F32A Depression, unspecified: Secondary | ICD-10-CM | POA: Diagnosis not present

## 2021-01-31 DIAGNOSIS — I69318 Other symptoms and signs involving cognitive functions following cerebral infarction: Secondary | ICD-10-CM | POA: Diagnosis not present

## 2021-01-31 DIAGNOSIS — I10 Essential (primary) hypertension: Secondary | ICD-10-CM | POA: Diagnosis not present

## 2021-01-31 DIAGNOSIS — J449 Chronic obstructive pulmonary disease, unspecified: Secondary | ICD-10-CM | POA: Diagnosis not present

## 2021-01-31 DIAGNOSIS — M6281 Muscle weakness (generalized): Secondary | ICD-10-CM | POA: Diagnosis not present

## 2021-01-31 DIAGNOSIS — E039 Hypothyroidism, unspecified: Secondary | ICD-10-CM | POA: Diagnosis not present

## 2021-01-31 DIAGNOSIS — E785 Hyperlipidemia, unspecified: Secondary | ICD-10-CM | POA: Diagnosis not present

## 2021-02-01 DIAGNOSIS — I10 Essential (primary) hypertension: Secondary | ICD-10-CM | POA: Diagnosis not present

## 2021-02-01 DIAGNOSIS — E039 Hypothyroidism, unspecified: Secondary | ICD-10-CM | POA: Diagnosis not present

## 2021-02-01 DIAGNOSIS — M6281 Muscle weakness (generalized): Secondary | ICD-10-CM | POA: Diagnosis not present

## 2021-02-01 DIAGNOSIS — J449 Chronic obstructive pulmonary disease, unspecified: Secondary | ICD-10-CM | POA: Diagnosis not present

## 2021-02-01 DIAGNOSIS — F32A Depression, unspecified: Secondary | ICD-10-CM | POA: Diagnosis not present

## 2021-02-01 DIAGNOSIS — E785 Hyperlipidemia, unspecified: Secondary | ICD-10-CM | POA: Diagnosis not present

## 2021-02-01 DIAGNOSIS — I69318 Other symptoms and signs involving cognitive functions following cerebral infarction: Secondary | ICD-10-CM | POA: Diagnosis not present

## 2021-02-04 DIAGNOSIS — I69318 Other symptoms and signs involving cognitive functions following cerebral infarction: Secondary | ICD-10-CM | POA: Diagnosis not present

## 2021-02-04 DIAGNOSIS — E785 Hyperlipidemia, unspecified: Secondary | ICD-10-CM | POA: Diagnosis not present

## 2021-02-04 DIAGNOSIS — F32A Depression, unspecified: Secondary | ICD-10-CM | POA: Diagnosis not present

## 2021-02-04 DIAGNOSIS — I10 Essential (primary) hypertension: Secondary | ICD-10-CM | POA: Diagnosis not present

## 2021-02-04 DIAGNOSIS — E039 Hypothyroidism, unspecified: Secondary | ICD-10-CM | POA: Diagnosis not present

## 2021-02-04 DIAGNOSIS — M6281 Muscle weakness (generalized): Secondary | ICD-10-CM | POA: Diagnosis not present

## 2021-02-04 DIAGNOSIS — J449 Chronic obstructive pulmonary disease, unspecified: Secondary | ICD-10-CM | POA: Diagnosis not present

## 2021-02-06 DIAGNOSIS — M6281 Muscle weakness (generalized): Secondary | ICD-10-CM | POA: Diagnosis not present

## 2021-02-06 DIAGNOSIS — E039 Hypothyroidism, unspecified: Secondary | ICD-10-CM | POA: Diagnosis not present

## 2021-02-06 DIAGNOSIS — F32A Depression, unspecified: Secondary | ICD-10-CM | POA: Diagnosis not present

## 2021-02-06 DIAGNOSIS — E785 Hyperlipidemia, unspecified: Secondary | ICD-10-CM | POA: Diagnosis not present

## 2021-02-06 DIAGNOSIS — J449 Chronic obstructive pulmonary disease, unspecified: Secondary | ICD-10-CM | POA: Diagnosis not present

## 2021-02-06 DIAGNOSIS — I10 Essential (primary) hypertension: Secondary | ICD-10-CM | POA: Diagnosis not present

## 2021-02-06 DIAGNOSIS — I69318 Other symptoms and signs involving cognitive functions following cerebral infarction: Secondary | ICD-10-CM | POA: Diagnosis not present

## 2021-02-07 DIAGNOSIS — J449 Chronic obstructive pulmonary disease, unspecified: Secondary | ICD-10-CM | POA: Diagnosis not present

## 2021-02-07 DIAGNOSIS — F32A Depression, unspecified: Secondary | ICD-10-CM | POA: Diagnosis not present

## 2021-02-07 DIAGNOSIS — I10 Essential (primary) hypertension: Secondary | ICD-10-CM | POA: Diagnosis not present

## 2021-02-07 DIAGNOSIS — I69318 Other symptoms and signs involving cognitive functions following cerebral infarction: Secondary | ICD-10-CM | POA: Diagnosis not present

## 2021-02-07 DIAGNOSIS — M6281 Muscle weakness (generalized): Secondary | ICD-10-CM | POA: Diagnosis not present

## 2021-02-07 DIAGNOSIS — E039 Hypothyroidism, unspecified: Secondary | ICD-10-CM | POA: Diagnosis not present

## 2021-02-07 DIAGNOSIS — E785 Hyperlipidemia, unspecified: Secondary | ICD-10-CM | POA: Diagnosis not present

## 2021-02-11 DIAGNOSIS — I10 Essential (primary) hypertension: Secondary | ICD-10-CM | POA: Diagnosis not present

## 2021-02-11 DIAGNOSIS — I69318 Other symptoms and signs involving cognitive functions following cerebral infarction: Secondary | ICD-10-CM | POA: Diagnosis not present

## 2021-02-11 DIAGNOSIS — M6281 Muscle weakness (generalized): Secondary | ICD-10-CM | POA: Diagnosis not present

## 2021-02-11 DIAGNOSIS — J449 Chronic obstructive pulmonary disease, unspecified: Secondary | ICD-10-CM | POA: Diagnosis not present

## 2021-02-11 DIAGNOSIS — E039 Hypothyroidism, unspecified: Secondary | ICD-10-CM | POA: Diagnosis not present

## 2021-02-11 DIAGNOSIS — F32A Depression, unspecified: Secondary | ICD-10-CM | POA: Diagnosis not present

## 2021-02-11 DIAGNOSIS — E785 Hyperlipidemia, unspecified: Secondary | ICD-10-CM | POA: Diagnosis not present

## 2021-02-12 DIAGNOSIS — F32A Depression, unspecified: Secondary | ICD-10-CM | POA: Diagnosis not present

## 2021-02-12 DIAGNOSIS — E559 Vitamin D deficiency, unspecified: Secondary | ICD-10-CM | POA: Diagnosis not present

## 2021-02-12 DIAGNOSIS — G4700 Insomnia, unspecified: Secondary | ICD-10-CM | POA: Diagnosis not present

## 2021-02-12 DIAGNOSIS — E785 Hyperlipidemia, unspecified: Secondary | ICD-10-CM | POA: Diagnosis not present

## 2021-02-12 DIAGNOSIS — J449 Chronic obstructive pulmonary disease, unspecified: Secondary | ICD-10-CM | POA: Diagnosis not present

## 2021-02-12 DIAGNOSIS — I10 Essential (primary) hypertension: Secondary | ICD-10-CM | POA: Diagnosis not present

## 2021-02-12 DIAGNOSIS — E039 Hypothyroidism, unspecified: Secondary | ICD-10-CM | POA: Diagnosis not present

## 2021-02-13 DIAGNOSIS — F32A Depression, unspecified: Secondary | ICD-10-CM | POA: Diagnosis not present

## 2021-02-13 DIAGNOSIS — I10 Essential (primary) hypertension: Secondary | ICD-10-CM | POA: Diagnosis not present

## 2021-02-13 DIAGNOSIS — E785 Hyperlipidemia, unspecified: Secondary | ICD-10-CM | POA: Diagnosis not present

## 2021-02-13 DIAGNOSIS — I69318 Other symptoms and signs involving cognitive functions following cerebral infarction: Secondary | ICD-10-CM | POA: Diagnosis not present

## 2021-02-13 DIAGNOSIS — J449 Chronic obstructive pulmonary disease, unspecified: Secondary | ICD-10-CM | POA: Diagnosis not present

## 2021-02-13 DIAGNOSIS — E039 Hypothyroidism, unspecified: Secondary | ICD-10-CM | POA: Diagnosis not present

## 2021-02-13 DIAGNOSIS — M6281 Muscle weakness (generalized): Secondary | ICD-10-CM | POA: Diagnosis not present

## 2021-02-25 DIAGNOSIS — D518 Other vitamin B12 deficiency anemias: Secondary | ICD-10-CM | POA: Diagnosis not present

## 2021-02-25 DIAGNOSIS — J449 Chronic obstructive pulmonary disease, unspecified: Secondary | ICD-10-CM | POA: Diagnosis not present

## 2021-02-25 DIAGNOSIS — E038 Other specified hypothyroidism: Secondary | ICD-10-CM | POA: Diagnosis not present

## 2021-02-25 DIAGNOSIS — E119 Type 2 diabetes mellitus without complications: Secondary | ICD-10-CM | POA: Diagnosis not present

## 2021-02-25 DIAGNOSIS — E785 Hyperlipidemia, unspecified: Secondary | ICD-10-CM | POA: Diagnosis not present

## 2021-02-25 DIAGNOSIS — I10 Essential (primary) hypertension: Secondary | ICD-10-CM | POA: Diagnosis not present

## 2021-02-25 DIAGNOSIS — E559 Vitamin D deficiency, unspecified: Secondary | ICD-10-CM | POA: Diagnosis not present

## 2021-02-25 DIAGNOSIS — E039 Hypothyroidism, unspecified: Secondary | ICD-10-CM | POA: Diagnosis not present

## 2021-03-04 DIAGNOSIS — J449 Chronic obstructive pulmonary disease, unspecified: Secondary | ICD-10-CM | POA: Diagnosis not present

## 2021-03-04 DIAGNOSIS — I69318 Other symptoms and signs involving cognitive functions following cerebral infarction: Secondary | ICD-10-CM | POA: Diagnosis not present

## 2021-03-04 DIAGNOSIS — E039 Hypothyroidism, unspecified: Secondary | ICD-10-CM | POA: Diagnosis not present

## 2021-03-04 DIAGNOSIS — I10 Essential (primary) hypertension: Secondary | ICD-10-CM | POA: Diagnosis not present

## 2021-03-04 DIAGNOSIS — F32A Depression, unspecified: Secondary | ICD-10-CM | POA: Diagnosis not present

## 2021-03-04 DIAGNOSIS — M6281 Muscle weakness (generalized): Secondary | ICD-10-CM | POA: Diagnosis not present

## 2021-03-04 DIAGNOSIS — E785 Hyperlipidemia, unspecified: Secondary | ICD-10-CM | POA: Diagnosis not present

## 2021-03-12 DIAGNOSIS — I10 Essential (primary) hypertension: Secondary | ICD-10-CM | POA: Diagnosis not present

## 2021-03-12 DIAGNOSIS — G4700 Insomnia, unspecified: Secondary | ICD-10-CM | POA: Diagnosis not present

## 2021-03-12 DIAGNOSIS — J449 Chronic obstructive pulmonary disease, unspecified: Secondary | ICD-10-CM | POA: Diagnosis not present

## 2021-03-12 DIAGNOSIS — E559 Vitamin D deficiency, unspecified: Secondary | ICD-10-CM | POA: Diagnosis not present

## 2021-03-12 DIAGNOSIS — E785 Hyperlipidemia, unspecified: Secondary | ICD-10-CM | POA: Diagnosis not present

## 2021-03-12 DIAGNOSIS — F32A Depression, unspecified: Secondary | ICD-10-CM | POA: Diagnosis not present

## 2021-03-12 DIAGNOSIS — E039 Hypothyroidism, unspecified: Secondary | ICD-10-CM | POA: Diagnosis not present

## 2021-03-20 DIAGNOSIS — E785 Hyperlipidemia, unspecified: Secondary | ICD-10-CM | POA: Diagnosis not present

## 2021-03-20 DIAGNOSIS — E039 Hypothyroidism, unspecified: Secondary | ICD-10-CM | POA: Diagnosis not present

## 2021-03-20 DIAGNOSIS — D518 Other vitamin B12 deficiency anemias: Secondary | ICD-10-CM | POA: Diagnosis not present

## 2021-03-20 DIAGNOSIS — J449 Chronic obstructive pulmonary disease, unspecified: Secondary | ICD-10-CM | POA: Diagnosis not present

## 2021-03-20 DIAGNOSIS — I10 Essential (primary) hypertension: Secondary | ICD-10-CM | POA: Diagnosis not present

## 2021-03-20 DIAGNOSIS — E559 Vitamin D deficiency, unspecified: Secondary | ICD-10-CM | POA: Diagnosis not present

## 2021-03-20 DIAGNOSIS — E038 Other specified hypothyroidism: Secondary | ICD-10-CM | POA: Diagnosis not present

## 2021-03-20 DIAGNOSIS — E119 Type 2 diabetes mellitus without complications: Secondary | ICD-10-CM | POA: Diagnosis not present

## 2021-04-09 DIAGNOSIS — R631 Polydipsia: Secondary | ICD-10-CM | POA: Diagnosis not present

## 2021-04-09 DIAGNOSIS — J449 Chronic obstructive pulmonary disease, unspecified: Secondary | ICD-10-CM | POA: Diagnosis not present

## 2021-04-09 DIAGNOSIS — E559 Vitamin D deficiency, unspecified: Secondary | ICD-10-CM | POA: Diagnosis not present

## 2021-04-09 DIAGNOSIS — E039 Hypothyroidism, unspecified: Secondary | ICD-10-CM | POA: Diagnosis not present

## 2021-04-09 DIAGNOSIS — I10 Essential (primary) hypertension: Secondary | ICD-10-CM | POA: Diagnosis not present

## 2021-04-09 DIAGNOSIS — E785 Hyperlipidemia, unspecified: Secondary | ICD-10-CM | POA: Diagnosis not present

## 2021-04-09 DIAGNOSIS — G4700 Insomnia, unspecified: Secondary | ICD-10-CM | POA: Diagnosis not present

## 2021-04-11 DIAGNOSIS — E119 Type 2 diabetes mellitus without complications: Secondary | ICD-10-CM | POA: Diagnosis not present

## 2021-04-11 DIAGNOSIS — D518 Other vitamin B12 deficiency anemias: Secondary | ICD-10-CM | POA: Diagnosis not present

## 2021-04-11 DIAGNOSIS — E7849 Other hyperlipidemia: Secondary | ICD-10-CM | POA: Diagnosis not present

## 2021-04-11 DIAGNOSIS — Z79899 Other long term (current) drug therapy: Secondary | ICD-10-CM | POA: Diagnosis not present

## 2021-06-20 ENCOUNTER — Emergency Department: Payer: Medicare Other

## 2021-06-20 ENCOUNTER — Other Ambulatory Visit: Payer: Self-pay

## 2021-06-20 ENCOUNTER — Emergency Department
Admission: EM | Admit: 2021-06-20 | Discharge: 2021-06-21 | Disposition: A | Discharge status: Home / Self Care | Payer: Medicare Other | Attending: Emergency Medicine | Admitting: Emergency Medicine

## 2021-06-20 DIAGNOSIS — R102 Pelvic and perineal pain: Secondary | ICD-10-CM | POA: Insufficient documentation

## 2021-06-20 DIAGNOSIS — F0390 Unspecified dementia without behavioral disturbance: Secondary | ICD-10-CM | POA: Diagnosis not present

## 2021-06-20 DIAGNOSIS — J441 Chronic obstructive pulmonary disease with (acute) exacerbation: Secondary | ICD-10-CM | POA: Insufficient documentation

## 2021-06-20 DIAGNOSIS — Z20822 Contact with and (suspected) exposure to covid-19: Secondary | ICD-10-CM | POA: Diagnosis not present

## 2021-06-20 DIAGNOSIS — R531 Weakness: Secondary | ICD-10-CM | POA: Diagnosis not present

## 2021-06-20 DIAGNOSIS — R0602 Shortness of breath: Secondary | ICD-10-CM | POA: Diagnosis present

## 2021-06-20 LAB — URINALYSIS, ROUTINE W REFLEX MICROSCOPIC
Bilirubin Urine: NEGATIVE
Glucose, UA: NEGATIVE mg/dL
Hgb urine dipstick: NEGATIVE
Ketones, ur: 20 mg/dL — AB
Leukocytes,Ua: NEGATIVE
Nitrite: NEGATIVE
Protein, ur: NEGATIVE mg/dL
Specific Gravity, Urine: 1.016 (ref 1.005–1.030)
pH: 5 (ref 5.0–8.0)

## 2021-06-20 LAB — BASIC METABOLIC PANEL
Anion gap: 11 (ref 5–15)
BUN: 12 mg/dL (ref 8–23)
CO2: 27 mmol/L (ref 22–32)
Calcium: 9.3 mg/dL (ref 8.9–10.3)
Chloride: 100 mmol/L (ref 98–111)
Creatinine, Ser: 0.81 mg/dL (ref 0.44–1.00)
GFR, Estimated: >60 mL/min (ref >60)
Glucose, Bld: 104 mg/dL — ABNORMAL HIGH (ref 70–99)
Potassium: 3.9 mmol/L (ref 3.5–5.1)
Sodium: 138 mmol/L (ref 135–145)

## 2021-06-20 LAB — RESP PANEL BY RT-PCR (FLU A&B, COVID) ARPGX2
Influenza A by PCR: NEGATIVE
Influenza B by PCR: NEGATIVE
SARS Coronavirus 2 by RT PCR: NEGATIVE

## 2021-06-20 LAB — HEPATIC FUNCTION PANEL
ALT: 8 U/L (ref 0–44)
AST: 13 U/L — ABNORMAL LOW (ref 15–41)
Albumin: 3.6 g/dL (ref 3.5–5.0)
Alkaline Phosphatase: 45 U/L (ref 38–126)
Bilirubin, Direct: <0.1 mg/dL (ref 0.0–0.2)
Total Bilirubin: 0.8 mg/dL (ref 0.3–1.2)
Total Protein: 7 g/dL (ref 6.5–8.1)

## 2021-06-20 LAB — CBC
HCT: 36.3 % (ref 36.0–46.0)
Hemoglobin: 12.2 g/dL (ref 12.0–15.0)
MCH: 31.9 pg (ref 26.0–34.0)
MCHC: 33.6 g/dL (ref 30.0–36.0)
MCV: 94.8 fL (ref 80.0–100.0)
Platelets: 158 10*3/uL (ref 150–400)
RBC: 3.83 MIL/uL — ABNORMAL LOW (ref 3.87–5.11)
RDW: 13.5 % (ref 11.5–15.5)
WBC: 7.7 10*3/uL (ref 4.0–10.5)
nRBC: 0 % (ref 0.0–0.2)

## 2021-06-20 LAB — TROPONIN I (HIGH SENSITIVITY): Troponin I (High Sensitivity): 3 ng/L (ref <18)

## 2021-06-20 MED ORDER — IOHEXOL 350 MG/ML SOLN
100.0000 mL | Freq: Once | INTRAVENOUS | Status: AC | PRN
  Administered 2021-06-20: 100 mL via INTRAVENOUS

## 2021-06-20 MED ORDER — IPRATROPIUM-ALBUTEROL 0.5-2.5 (3) MG/3ML IN SOLN
3.0000 mL | Freq: Once | RESPIRATORY_TRACT | Status: AC
  Administered 2021-06-20: 3 mL via RESPIRATORY_TRACT

## 2021-06-20 MED ORDER — METHYLPREDNISOLONE SODIUM SUCC 125 MG IJ SOLR
125.0000 mg | Freq: Once | INTRAMUSCULAR | Status: AC
  Administered 2021-06-20: 125 mg via INTRAVENOUS
  Filled 2021-06-20: qty 2

## 2021-06-20 MED ORDER — ACETAMINOPHEN 500 MG PO TABS
1000.0000 mg | ORAL_TABLET | Freq: Once | ORAL | Status: AC
  Administered 2021-06-20: 1000 mg via ORAL
  Filled 2021-06-20: qty 2

## 2021-06-20 MED ORDER — IPRATROPIUM-ALBUTEROL 0.5-2.5 (3) MG/3ML IN SOLN
3.0000 mL | Freq: Once | RESPIRATORY_TRACT | Status: AC
  Administered 2021-06-20: 3 mL via RESPIRATORY_TRACT
  Filled 2021-06-20: qty 6

## 2021-06-20 NOTE — ED Triage Notes (Signed)
Pt comes with c/o weakness, productive cough and right knee issue. EMS reports they were called out for right knee pain. Pt's knee apparently gave out earlier. Pt denied any pain with EMS.   Pt has hx of dementia. Family also reports some bleeding unknown if rectal or vaginally that they noticed yesterday while out with pt. Facility informed of this but no known tests performed.   Family reports pt does usually have bronchitis this time of year. Pt states some more labored breathing when walking.  VSS, [id number]

## 2021-06-20 NOTE — ED Provider Notes (Addendum)
[facility] Emergency Department Provider Note  ____________________________________________   Event Date/Time   First MD Initiated Contact with Patient 06/20/21 2152     (approximate)  I have reviewed the triage vital signs and the nursing notes.   HISTORY  Chief Complaint Weakness and Shortness of Breath    HPI Madeline Hernandez is a 87 y.o. female who comes in with concerns for weakness, productive cough.  Son is at bedside given patient has dementia history was obtained by him.  He reports that since yesterday she is had some worsening cough and weakness.  He states that she gets bronchitis and it flares up.  She has been taking her inhalers.  They are also concerned that she is having some bleeding.  They are unsure if it is rectal or vaginally but they wanted to be further worked up.  He is concerned because she has developed some lower pelvic pain.  Denies any recent falls, headaches.  Patient also has some chronic pain in her right knee and occasionally gives out but she still able to ambulate and walk.  Unable to get full HPI from patient due to patient's baseline dementia   History reviewed. No pertinent past medical history.  There are no problems to display for this patient.   History reviewed. No pertinent surgical history.  Prior to Admission medications   Not on File    Allergies Patient has no allergy information on record.  No family history on file.  Social History Patient is in a facility.  No drugs  Review of Systems Constitutional: No fever/chills Eyes: No visual changes. ENT: No sore throat. Cardiovascular: No chest pain Respiratory: Positive for SOB, cough Gastrointestinal: No abdominal pain.  No nausea, no vomiting.  No diarrhea.  No constipation. Genitourinary: Negative for dysuria.  Pelvic pain, possible bleeding Musculoskeletal: Negative for back pain. Skin: Negative for rash. Neurological: Negative for headaches,  focal weakness or numbness. All other ROS negative ____________________________________________   PHYSICAL EXAM:  VITAL SIGNS: ED Triage Vitals  Enc Vitals Group     BP 06/20/21 1528 (!) 118/93     Pulse Rate 06/20/21 1528 84     Resp 06/20/21 1528 20     Temp 06/20/21 1528 99.3 F (37.4 C)     Temp Source 06/20/21 1528 Oral     SpO2 06/20/21 1528 92 %     Weight --      Height --      Head Circumference --      Peak Flow --      Pain Score 06/20/21 1514 0     Pain Loc --      Pain Edu? --      Excl. in [place]? --     Constitutional: Patient is awake but does have some baseline dementia Eyes: Conjunctivae are normal. EOMI. Head: Atraumatic. Nose: No congestion/rhinnorhea. Mouth/Throat: Mucous membranes are moist.   Neck: No stridor. Trachea Midline. FROM Cardiovascular: Normal rate, regular rhythm. Grossly normal heart sounds.  Good peripheral circulation. Respiratory: Maybe some tight air lungs but no significant wheezing, normal work of breathing.  Occasional cough Gastrointestinal: Soft and nontender. No distention. No abdominal bruits.  Musculoskeletal: No lower extremity tenderness nor edema.  No joint effusions. Neurologic:  Normal speech and language. No gross focal neurologic deficits are appreciated.  Skin:  Skin is warm, dry and intact. No rash noted. Psychiatric: Mood and affect are normal. Speech and behavior are normal. GU: No vaginal bleeding with insertion  of finger.  No rectal bleeding with insertion of finger.  ____________________________________________   LABS (all labs ordered are listed, but only abnormal results are displayed)  Labs Reviewed  BASIC METABOLIC PANEL - Abnormal; Notable for the following components:      Result Value   Glucose, Bld 104 (*)    All other components within normal limits  CBC - Abnormal; Notable for the following components:   RBC 3.83 (*)    All other components within normal limits  URINALYSIS, ROUTINE W REFLEX  MICROSCOPIC - Abnormal; Notable for the following components:   Color, Urine YELLOW (*)    APPearance CLEAR (*)    Ketones, ur 20 (*)    All other components within normal limits  CBG MONITORING, ED   ____________________________________________   ED ECG REPORT I, Vanessa [facility], the attending physician, personally viewed and interpreted this ECG.  Normal sinus rate 83, no ST elevation, no T wave version except for lead III, normal intervals  ____________________________________________  RADIOLOGY   Official radiology report(s): DG Chest 1 View  Result Date: 06/20/2021 CLINICAL DATA:  Productive cough and weakness. EXAM: CHEST  1 VIEW COMPARISON:  None. FINDINGS: The lungs are hyperinflated. Suspect apical emphysema. Mild bronchial thickening and interstitial coarsening. No confluent pneumonia. Minor subsegmental atelectasis at the bases. Heart is normal in size. Aortic atherosclerosis. Questionable 6 mm nodule in the left upper lung versus overlap from the third rib costochondral junction. No prior exams available for comparison. No pulmonary edema, pleural effusion, or pneumothorax. The bones are under mineralized without acute osseous findings. IMPRESSION: 1. Hyperinflation with bronchial thickening and interstitial coarsening, may represent bronchitis or chronic lung disease/COPD. 2. Questionable 6 mm left upper lung nodule. No prior exams available for comparison. Recommend follow-up chest CT. This can be performed on an elective basis. Electronically Signed   By: Keith Rake M.D.   On: 06/20/2021 16:29   CT Angio Chest PE W and/or Wo Contrast  Result Date: 06/20/2021 CLINICAL DATA:  PE suspected, high prob 87 year old dementia patient with weakness and productive cough. EXAM: CT ANGIOGRAPHY CHEST WITH CONTRAST TECHNIQUE: Multidetector CT imaging of the chest was performed using the standard protocol during bolus administration of intravenous contrast. Multiplanar CT image  reconstructions and MIPs were obtained to evaluate the vascular anatomy. CONTRAST:  147mL OMNIPAQUE IOHEXOL 350 MG/ML SOLN COMPARISON:  Chest radiograph earlier today FINDINGS: Cardiovascular: There are no filling defects within the pulmonary arteries to suggest pulmonary embolus. Breathing motion artifact limits basilar assessment. Atherosclerosis of the thoracic aorta. No aortic dissection or acute aortic findings. Heart size is normal. There are coronary artery calcifications. No pericardial effusion. Mediastinum/Nodes: Enlarged right hilar lymph node measures 16 mm short axis. There is scattered prominent mediastinal and left hilar nodes that are not enlarged by size criteria. No esophageal wall thickening. Lungs/Pleura: Moderate emphysema. Moderate central bronchial thickening. Areas of mucous plugging and bronchial impaction in the right greater than left lower lobes. Heterogeneous pulmonary parenchyma. No confluent consolidation. No pleural effusion. No pulmonary mass. Upper Abdomen: Assessed on concurrent abdominal CT, reported separately. Musculoskeletal: Bony under mineralization. Chronic right shoulder arthropathy. There are no acute or suspicious osseous abnormalities. No chest wall soft tissue abnormality. Review of the MIP images confirms the above findings. IMPRESSION: 1. No pulmonary embolus. 2. Moderate emphysema and bronchial thickening. Areas of bronchial filling and occlusion in the right lower lobe. Heterogeneous pulmonary parenchyma most consistent with small airways disease. 3. No pneumonia. 4. Enlarged right hilar lymph node is  likely reactive. 5. Aortic atherosclerosis.  Coronary artery calcifications. Aortic Atherosclerosis (ICD10-I70.0) and Emphysema (ICD10-J43.9). Electronically Signed   By: Keith Rake M.D.   On: 06/20/2021 22:55   CT ABDOMEN PELVIS W CONTRAST  Result Date: 06/20/2021 CLINICAL DATA:  Abdominal distension 87 year old dementia patient with weakness and productive  cough. EXAM: CT ABDOMEN AND PELVIS WITH CONTRAST TECHNIQUE: Multidetector CT imaging of the abdomen and pelvis was performed using the standard protocol following bolus administration of intravenous contrast. CONTRAST:  158mL OMNIPAQUE IOHEXOL 350 MG/ML SOLN COMPARISON:  None. FINDINGS: Lower chest: Assessed on concurrent chest CT, reported separately. Hepatobiliary: Subcentimeter low-density focus in the right lobe is likely cyst, but too small to characterize. There is no suspicious liver lesion. Breathing motion artifact limits parenchymal assessment. Gallbladder physiologically distended, no calcified stone. No biliary dilatation. Pancreas: Moderate motion artifact through the pancreas. Allowing for this, no focal abnormality is seen. No ductal dilatation or inflammation. Spleen: Normal in size without focal abnormality. Adrenals/Urinary Tract: Normal adrenal glands. No hydronephrosis or perinephric edema. Homogeneous renal enhancement with symmetric excretion on delayed phase imaging. Bilateral renal cysts, including the left parapelvic cyst. The urinary bladder is distended. There is a small right posterolateral bladder diverticulum. No bladder wall thickening. Stomach/Bowel: Bowel evaluation is limited due to moderate breathing motion artifact. No evidence of bowel obstruction or inflammation. Surgical clips at the base of the cecum suggest prior appendectomy. Multifocal colonic diverticulosis, prominent in the sigmoid colon. No diverticulitis or acute colonic inflammation. Vascular/Lymphatic: Moderate atherosclerosis of the abdominal aorta. No aortic aneurysm. Patent portal vein. No portal venous or mesenteric gas. There is no bulky abdominopelvic adenopathy. Reproductive: Normal for age uterine atrophy. Quiescent ovaries. No adnexal mass. Other: No free air, free fluid, or intra-abdominal fluid collection. Fat containing right inguinal hernia. Musculoskeletal: Advanced bilateral hip osteoarthritis. Moderate  degenerative change throughout the spine. Incidental hemangioma within L4 vertebral body. No acute osseous findings. IMPRESSION: 1. No acute abnormality in the abdomen/pelvis. 2. Colonic diverticulosis without diverticulitis. 3. Distended urinary bladder with small right posterolateral bladder diverticulum. No bladder wall thickening. Aortic Atherosclerosis (ICD10-I70.0). Electronically Signed   By: Keith Rake M.D.   On: 06/20/2021 23:02    ____________________________________________   PROCEDURES  Procedure(s) performed (including Critical Care):  Procedures   ____________________________________________   INITIAL IMPRESSION / ASSESSMENT AND PLAN / ED COURSE   Charity Tessier was evaluated in Emergency Department on 06/20/2021 for the symptoms described in the history of present illness. She was evaluated in the context of the global COVID-19 pandemic, which necessitated consideration that the patient might be at risk for infection with the SARS-CoV-2 virus that causes COVID-19. Institutional protocols and algorithms that pertain to the evaluation of patients at risk for COVID-19 are in a state of rapid change based on information released by regulatory bodies including the CDC and federal and state organizations. These policies and algorithms were followed during the patient's care in the ED.     Pt presents with SOB.  I suspect this is most likely from her known COPD.  Difficult to know where the bleeding is coming from.  We did a vaginal and rectal exam without any gross blood and brown stool.  We discussed that a ultrasound would be very hard to do given it was very hard even to do the digital examination due to patient's dementia and not tolerating it very well.  They are still concerned about the pelvic pain where it is coming from.  We discussed we could try to  do a CT scan to see if there is any evidence of large mass but would not be the best to ruling out some kind of intrauterine  cancer if there was vaginal bleeding.  They expressed understanding but would like to proceed with CT imaging.  Given she does not have a lot of wheezing on exam we will also get CT PE just to make sure no evidence of pulmonary embolism and to make sure there is no cancer given the concern for possible nodule on x-ray.  CT imaging was reassuring other than a distended bladder.  Patient was able to urinate on her own.  Patient's oxygen levels a couple of them were charted low at 88% but this is when it was on her finger and she was picking them off.  According to family her baseline oxygen level is 90 to 93%.  When is on her toe she has normal oxygen levels above 92% and does not have any significant shortness of breath.  She states that her breathing feels much better after giving a DuoNeb, steroids.  Symptoms comfortable with her going back to the facility.  Patient is urinary bladder was distended on the CT scan but patient has no issues going to the bathroom.  Patient was able to get up with a walker and urinate into the bathroom over 200 cc.  At this time do not feel if she is having significant retention requiring Foley.  Given her dementia I think a Foley can be very traumatic for her and she could accidentally pull it out due to not understanding why it is there.  This time she still urinating on her own she got normal kidney function no evidence of significant obstruction.  Do not feel like foley indicated. However the son knows that they can monitor it at the facility where if she stops urinating that that could be a sign of retention.  No evidence of UTI off of urine analysis but will send for urine culture just to be safe given the pelvic pain.  On reassessment patient continues to have normal work of breathing, with saturations above 92%.    ____________________________________________   FINAL CLINICAL IMPRESSION(S) / ED DIAGNOSES   Final diagnoses:  SOB (shortness of breath)  COPD  exacerbation (HCC)     MEDICATIONS GIVEN DURING THIS VISIT:  Medications  acetaminophen (TYLENOL) tablet 1,000 mg (1,000 mg Oral Given 06/20/21 2254)  ipratropium-albuterol (DUONEB) 0.5-2.5 (3) MG/3ML nebulizer solution 3 mL (3 mLs Nebulization Given 06/20/21 2254)  methylPREDNISolone sodium succinate (SOLU-MEDROL) 125 mg/2 mL injection 125 mg (125 mg Intravenous Given 06/20/21 2254)  ipratropium-albuterol (DUONEB) 0.5-2.5 (3) MG/3ML nebulizer solution 3 mL (3 mLs Nebulization Given 06/20/21 2254)  iohexol (OMNIPAQUE) 350 MG/ML injection 100 mL (100 mLs Intravenous Contrast Given 06/20/21 2236)     ED Discharge Orders     None        Note:  This document was prepared using Dragon voice recognition software and may include unintentional dictation errors.   Vanessa [place], MD 06/21/21 0013    Vanessa , MD 06/21/21 [phone]

## 2021-06-21 LAB — URINE CULTURE

## 2021-06-21 MED ORDER — AZITHROMYCIN 250 MG PO TABS: ORAL_TABLET | ORAL | 0 refills | Status: AC

## 2021-06-21 MED ORDER — PREDNISONE 20 MG PO TABS: 40.0000 mg | ORAL_TABLET | Freq: Every day | ORAL | 0 refills | Status: AC

## 2021-06-21 NOTE — ED Notes (Signed)
Patient stable and discharged with all personal belongings and AVS. AVS and discharge instructions reviewed with patient and opportunity for questions provided.   

## 2021-06-21 NOTE — Discharge Instructions (Addendum)
I suspect that she has a COPD exacerbation.  We will start her on a course of steroids and antibiotics to help with this.  Return to the ER if she develops worsening shortness of breath otherwise she can use her inhaler every 4-6 hours to help with the shortness of breath.  Make sure the facility monitors her urine output.  If she starts having decreased urine output or not urinating then she may need to be further worked up for urinary retention.

## 2021-11-05 ENCOUNTER — Emergency Department: Payer: Medicare Other

## 2021-11-05 ENCOUNTER — Emergency Department
Admission: EM | Admit: 2021-11-05 | Discharge: 2021-11-05 | Disposition: A | Discharge status: Home / Self Care | Payer: Medicare Other | Attending: Emergency Medicine | Admitting: Emergency Medicine

## 2021-11-05 DIAGNOSIS — T07XXXA Unspecified multiple injuries, initial encounter: Secondary | ICD-10-CM

## 2021-11-05 DIAGNOSIS — F0390 Unspecified dementia without behavioral disturbance: Secondary | ICD-10-CM | POA: Insufficient documentation

## 2021-11-05 DIAGNOSIS — W19XXXA Unspecified fall, initial encounter: Secondary | ICD-10-CM | POA: Insufficient documentation

## 2021-11-05 DIAGNOSIS — S2020XA Contusion of thorax, unspecified, initial encounter: Secondary | ICD-10-CM | POA: Insufficient documentation

## 2021-11-05 DIAGNOSIS — S60212A Contusion of left wrist, initial encounter: Secondary | ICD-10-CM | POA: Insufficient documentation

## 2021-11-05 DIAGNOSIS — S60211A Contusion of right wrist, initial encounter: Secondary | ICD-10-CM | POA: Insufficient documentation

## 2021-11-05 DIAGNOSIS — S299XXA Unspecified injury of thorax, initial encounter: Secondary | ICD-10-CM | POA: Diagnosis present

## 2021-11-05 HISTORY — DX: Chronic obstructive pulmonary disease, unspecified: J44.9

## 2021-11-05 HISTORY — DX: Unspecified dementia, unspecified severity, without behavioral disturbance, psychotic disturbance, mood disturbance, and anxiety: F03.90

## 2021-11-05 HISTORY — DX: Hyperlipidemia, unspecified: E78.5

## 2021-11-05 HISTORY — DX: Essential (primary) hypertension: I10

## 2021-11-05 LAB — CBC WITH DIFFERENTIAL/PLATELET
Abs Immature Granulocytes: 0.04 10*3/uL (ref 0.00–0.07)
Basophils Absolute: 0 10*3/uL (ref 0.0–0.1)
Basophils Relative: 0 %
Eosinophils Absolute: 0 10*3/uL (ref 0.0–0.5)
Eosinophils Relative: 0 %
HCT: 35.8 % — ABNORMAL LOW (ref 36.0–46.0)
Hemoglobin: 11.8 g/dL — ABNORMAL LOW (ref 12.0–15.0)
Immature Granulocytes: 0 %
Lymphocytes Relative: 13 %
Lymphs Abs: 1.2 10*3/uL (ref 0.7–4.0)
MCH: 30.5 pg (ref 26.0–34.0)
MCHC: 33 g/dL (ref 30.0–36.0)
MCV: 92.5 fL (ref 80.0–100.0)
Monocytes Absolute: 0.9 10*3/uL (ref 0.1–1.0)
Monocytes Relative: 9 %
Neutro Abs: 7.2 10*3/uL (ref 1.7–7.7)
Neutrophils Relative %: 78 %
Platelets: 197 10*3/uL (ref 150–400)
RBC: 3.87 MIL/uL (ref 3.87–5.11)
RDW: 13.7 % (ref 11.5–15.5)
WBC: 9.4 10*3/uL (ref 4.0–10.5)
nRBC: 0 % (ref 0.0–0.2)

## 2021-11-05 LAB — COMPREHENSIVE METABOLIC PANEL
ALT: 16 U/L (ref 0–44)
AST: 23 U/L (ref 15–41)
Albumin: 3.9 g/dL (ref 3.5–5.0)
Alkaline Phosphatase: 51 U/L (ref 38–126)
Anion gap: 8 (ref 5–15)
BUN: 20 mg/dL (ref 8–23)
CO2: 27 mmol/L (ref 22–32)
Calcium: 9.2 mg/dL (ref 8.9–10.3)
Chloride: 100 mmol/L (ref 98–111)
Creatinine, Ser: 0.77 mg/dL (ref 0.44–1.00)
GFR, Estimated: >60 mL/min (ref >60)
Glucose, Bld: 79 mg/dL (ref 70–99)
Potassium: 3.9 mmol/L (ref 3.5–5.1)
Sodium: 135 mmol/L (ref 135–145)
Total Bilirubin: 0.5 mg/dL (ref 0.3–1.2)
Total Protein: 6.9 g/dL (ref 6.5–8.1)

## 2021-11-05 LAB — URINALYSIS, ROUTINE W REFLEX MICROSCOPIC
Bilirubin Urine: NEGATIVE
Glucose, UA: NEGATIVE mg/dL
Hgb urine dipstick: NEGATIVE
Ketones, ur: NEGATIVE mg/dL
Leukocytes,Ua: NEGATIVE
Nitrite: NEGATIVE
Protein, ur: NEGATIVE mg/dL
Specific Gravity, Urine: 1.019 (ref 1.005–1.030)
pH: 6 (ref 5.0–8.0)

## 2021-11-05 LAB — VALPROIC ACID LEVEL: Valproic Acid Lvl: 95 ug/mL (ref 50.0–100.0)

## 2021-11-05 NOTE — ED Notes (Signed)
Pt to MRI

## 2021-11-05 NOTE — ED Provider Notes (Signed)
? ?[facility] ?Provider Note ? ? ? Event Date/Time  ? First MD Initiated Contact with Patient 11/05/21 1117   ?  (approximate) ? ? ?History  ? ?Fall ? ? ?HPI ? ?Madeline Hernandez is a 88 y.o. female with history of dementia, chronic UTIs presents from a assisted living care center after a fall.  They state the patient was trying to get up to go the bathroom as she has been complaining about pain with urination.  However she has not been able to urinate.  All information is from EMS as the patient has dementia and is not able to answer questions appropriately.  She also has bruising across her chest and on her wrist from the recent fall.  She has been ambulatory since the fall. ? ?  ? ? ?Physical Exam  ? ?Triage Vital Signs: ?ED Triage Vitals  ?Enc Vitals Group  ?   BP 11/05/21 1122 (!) 158/79  ?   Pulse Rate 11/05/21 1122 92  ?   Resp 11/05/21 1122 18  ?   Temp 11/05/21 1122 98.7 ?F (37.1 ?C)  ?   Temp Source 11/05/21 1122 Oral  ?   SpO2 11/05/21 1122 94 %  ?   Weight 11/05/21 1124 145 lb (65.8 kg)  ?   Height 11/05/21 1124 '5\' 4"'$  (1.626 m)  ?   Head Circumference --   ?   Peak Flow --   ?   Pain Score --   ?   Pain Loc --   ?   Pain Edu? --   ?   Excl. in [place]? --   ? ? ?Most recent vital signs: ?Vitals:  ? 11/05/21 1122  ?BP: (!) 158/79  ?Pulse: 92  ?Resp: 18  ?Temp: 98.7 ?F (37.1 ?C)  ?SpO2: 94%  ? ? ? ?General: Awake, no distress.   ?CV:  Good peripheral perfusion. regular rate and  rhythm ?Resp:  Normal effort.  Cough is wet, ?Abd:  No distention.   ?Other:  Bruising across the chest in a linear pattern, bruising at the wrist bilaterally, patient winces when the humerus's are palpated, C-spines mildly tender.  Neurovascular is intact at this time. ? ? ?ED Results / Procedures / Treatments  ? ?Labs ?(all labs ordered are listed, but only abnormal results are displayed) ?Labs Reviewed  ?CBC WITH DIFFERENTIAL/PLATELET - Abnormal; Notable for the following components:  ?    Result Value  ?  Hemoglobin 11.8 (*)   ? HCT 35.8 (*)   ? All other components within normal limits  ?URINALYSIS, ROUTINE W REFLEX MICROSCOPIC - Abnormal; Notable for the following components:  ? Color, Urine YELLOW (*)   ? APPearance CLEAR (*)   ? All other components within normal limits  ?COMPREHENSIVE METABOLIC PANEL  ?VALPROIC ACID LEVEL  ? ? ? ?EKG ? ? ? ? ?RADIOLOGY ?CT of the head ?Chest x-ray ?X-ray of the right and left humerus, right and left forearm ? ? ? ?PROCEDURES: ? ? ?Procedures ? ? ?MEDICATIONS ORDERED IN ED: ?Medications - No data to display ? ? ?IMPRESSION / MDM / ASSESSMENT AND PLAN / ED COURSE  ?I reviewed the triage vital signs and the nursing notes. ?             ?               ? ?Differential diagnosis includes, but is not limited to, subdural, subarachnoid, C-spine fracture, sternal fracture, humerus fracture, forearm fracture/wrist fracture, UTI ? ?  I do not feel the patient is septic as she is afebrile and her heart rate is normal.  Do have limited amount of information due to her dementia. ? ?I will order labs and imaging due to patient's fall and questionable UTI/altered mental status ? ?Family is here.  Her son states that she has a a lot of fidgeting and tremors which are normal for her.  States she usually cannot stand up without help when he visits.  Unsure of how she got up out of the bed at the assisted living, [facility]. ? ?Labs are reassuring, CBC and metabolic panel are normal, patient does not have any kind of metabolic abnormality at this time. ? ?Imaging is also reassuring, x-rays of the left forearm, right forearm, left humerus and right humerus were independently reviewed by me and I do not see a fracture.  Confirmed by radiology. ? ?CT of the head without contrast independently reviewed by me.  I do not see a subdural or subarachnoid hemorrhage.  Confirmed as no intracranial acute abnormality noted by radiology. ? ?CT of the cervical spine was reviewed by me.  Radiologist has read as no  fracture. ? ?We are awaiting the UA results.  If normal will discharge patient back to [facility]. ?UA is normal, no sign of infection. ? ?I did discuss these findings with the patient and her son.  Son states that he is concerned that she has been talking "gibberish "and unable to find her words.  We will order MRI of the brain to ensure she has not had a stroke. ? ?MRI shows old and chronic changes.  Did explain this to the family.  Will add depakote level as they are concerned the home is giving her depakote when they had told them not to.  Patient is being discharged in stable condition in the care of her son. ? ? ? ?  ? ? ?FINAL CLINICAL IMPRESSION(S) / ED DIAGNOSES  ? ?Final diagnoses:  ?Fall, initial encounter  ?Multiple contusions  ? ? ? ?Rx / DC Orders  ? ?ED Discharge Orders   ? ? None  ? ?  ? ? ? ?Note:  This document was prepared using Dragon voice recognition software and may include unintentional dictation errors. ? ?  ?Versie Starks, PA-C ?11/05/21 1543 ? ?  ?Carrie Mew, MD ?11/07/21 [phone] ? ?

## 2021-11-05 NOTE — ED Notes (Signed)
Patient transported to CT 

## 2021-11-05 NOTE — ED Notes (Signed)
Pt return from MRI

## 2021-11-05 NOTE — ED Triage Notes (Signed)
Pt BIBA for unwitnessed fall from [facility]. +dementia. A/ox2 baseline. Pt has bruising to chest and right wrist. -blood thinners. +urinary urgency and odor ?

## 2021-11-05 NOTE — Discharge Instructions (Signed)
Follow-up with your regular doctor as needed. 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 ?Return to the ER if worsening ?

## 2021-11-05 NOTE — ED Notes (Signed)
Pt NAD, a/ox2. Pt son verbalizes understanding of all DC and f/u instructions. All questions answered. Pt assisted into w/c by tech and wheeled to lobby at DC. ?

## 2022-08-12 ENCOUNTER — Emergency Department: Payer: Medicare Other

## 2022-08-12 ENCOUNTER — Other Ambulatory Visit: Payer: Self-pay

## 2022-08-12 ENCOUNTER — Inpatient Hospital Stay
Admission: EM | Admit: 2022-08-12 | Discharge: 2022-08-14 | DRG: 082 | Disposition: A | Discharge status: Skilled Nursing Facility | Payer: Medicare Other | Source: Skilled Nursing Facility | Attending: Internal Medicine | Admitting: Internal Medicine

## 2022-08-12 ENCOUNTER — Encounter: Payer: Self-pay | Admitting: Internal Medicine

## 2022-08-12 DIAGNOSIS — Y92129 Unspecified place in nursing home as the place of occurrence of the external cause: Secondary | ICD-10-CM

## 2022-08-12 DIAGNOSIS — I10 Essential (primary) hypertension: Secondary | ICD-10-CM | POA: Diagnosis present

## 2022-08-12 DIAGNOSIS — S2241XA Multiple fractures of ribs, right side, initial encounter for closed fracture: Secondary | ICD-10-CM | POA: Diagnosis present

## 2022-08-12 DIAGNOSIS — S0990XA Unspecified injury of head, initial encounter: Secondary | ICD-10-CM | POA: Diagnosis not present

## 2022-08-12 DIAGNOSIS — W19XXXA Unspecified fall, initial encounter: Secondary | ICD-10-CM | POA: Diagnosis present

## 2022-08-12 DIAGNOSIS — J449 Chronic obstructive pulmonary disease, unspecified: Secondary | ICD-10-CM | POA: Diagnosis present

## 2022-08-12 DIAGNOSIS — S2249XA Multiple fractures of ribs, unspecified side, initial encounter for closed fracture: Secondary | ICD-10-CM | POA: Diagnosis present

## 2022-08-12 DIAGNOSIS — F03918 Unspecified dementia, unspecified severity, with other behavioral disturbance: Secondary | ICD-10-CM | POA: Diagnosis present

## 2022-08-12 DIAGNOSIS — Z515 Encounter for palliative care: Secondary | ICD-10-CM | POA: Diagnosis not present

## 2022-08-12 DIAGNOSIS — Z1152 Encounter for screening for COVID-19: Secondary | ICD-10-CM

## 2022-08-12 DIAGNOSIS — S065XAA Traumatic subdural hemorrhage with loss of consciousness status unknown, initial encounter: Principal | ICD-10-CM | POA: Diagnosis present

## 2022-08-12 DIAGNOSIS — Z79899 Other long term (current) drug therapy: Secondary | ICD-10-CM | POA: Diagnosis not present

## 2022-08-12 DIAGNOSIS — Z79890 Hormone replacement therapy (postmenopausal): Secondary | ICD-10-CM | POA: Diagnosis not present

## 2022-08-12 DIAGNOSIS — Z8673 Personal history of transient ischemic attack (TIA), and cerebral infarction without residual deficits: Secondary | ICD-10-CM

## 2022-08-12 DIAGNOSIS — E785 Hyperlipidemia, unspecified: Secondary | ICD-10-CM | POA: Diagnosis present

## 2022-08-12 DIAGNOSIS — W1830XA Fall on same level, unspecified, initial encounter: Secondary | ICD-10-CM | POA: Diagnosis present

## 2022-08-12 DIAGNOSIS — J9621 Acute and chronic respiratory failure with hypoxia: Secondary | ICD-10-CM | POA: Diagnosis present

## 2022-08-12 DIAGNOSIS — Z9981 Dependence on supplemental oxygen: Secondary | ICD-10-CM

## 2022-08-12 DIAGNOSIS — Z23 Encounter for immunization: Secondary | ICD-10-CM

## 2022-08-12 DIAGNOSIS — Z7982 Long term (current) use of aspirin: Secondary | ICD-10-CM

## 2022-08-12 DIAGNOSIS — S32010A Wedge compression fracture of first lumbar vertebra, initial encounter for closed fracture: Secondary | ICD-10-CM

## 2022-08-12 DIAGNOSIS — S32000A Wedge compression fracture of unspecified lumbar vertebra, initial encounter for closed fracture: Secondary | ICD-10-CM | POA: Diagnosis present

## 2022-08-12 DIAGNOSIS — Z880 Allergy status to penicillin: Secondary | ICD-10-CM | POA: Diagnosis not present

## 2022-08-12 DIAGNOSIS — E039 Hypothyroidism, unspecified: Secondary | ICD-10-CM | POA: Diagnosis present

## 2022-08-12 DIAGNOSIS — J9601 Acute respiratory failure with hypoxia: Secondary | ICD-10-CM

## 2022-08-12 DIAGNOSIS — S32009A Unspecified fracture of unspecified lumbar vertebra, initial encounter for closed fracture: Secondary | ICD-10-CM

## 2022-08-12 DIAGNOSIS — Z7189 Other specified counseling: Secondary | ICD-10-CM | POA: Diagnosis not present

## 2022-08-12 LAB — CBC WITH DIFFERENTIAL/PLATELET
Abs Immature Granulocytes: 0.05 10*3/uL (ref 0.00–0.07)
Basophils Absolute: 0 10*3/uL (ref 0.0–0.1)
Basophils Relative: 0 %
Eosinophils Absolute: 0 10*3/uL (ref 0.0–0.5)
Eosinophils Relative: 0 %
HCT: 41.4 % (ref 36.0–46.0)
Hemoglobin: 13.7 g/dL (ref 12.0–15.0)
Immature Granulocytes: 0 %
Lymphocytes Relative: 17 %
Lymphs Abs: 2.2 10*3/uL (ref 0.7–4.0)
MCH: 30 pg (ref 26.0–34.0)
MCHC: 33.1 g/dL (ref 30.0–36.0)
MCV: 90.8 fL (ref 80.0–100.0)
Monocytes Absolute: 0.9 10*3/uL (ref 0.1–1.0)
Monocytes Relative: 7 %
Neutro Abs: 9.4 10*3/uL — ABNORMAL HIGH (ref 1.7–7.7)
Neutrophils Relative %: 76 %
Platelets: 230 10*3/uL (ref 150–400)
RBC: 4.56 MIL/uL (ref 3.87–5.11)
RDW: 13.5 % (ref 11.5–15.5)
WBC: 12.6 10*3/uL — ABNORMAL HIGH (ref 4.0–10.5)
nRBC: 0 % (ref 0.0–0.2)

## 2022-08-12 LAB — COMPREHENSIVE METABOLIC PANEL
ALT: 15 U/L (ref 0–44)
AST: 22 U/L (ref 15–41)
Albumin: 4.2 g/dL (ref 3.5–5.0)
Alkaline Phosphatase: 58 U/L (ref 38–126)
Anion gap: 11 (ref 5–15)
BUN: 15 mg/dL (ref 8–23)
CO2: 24 mmol/L (ref 22–32)
Calcium: 9.1 mg/dL (ref 8.9–10.3)
Chloride: 98 mmol/L (ref 98–111)
Creatinine, Ser: 0.64 mg/dL (ref 0.44–1.00)
GFR, Estimated: >60 mL/min (ref >60)
Glucose, Bld: 143 mg/dL — ABNORMAL HIGH (ref 70–99)
Potassium: 3.7 mmol/L (ref 3.5–5.1)
Sodium: 133 mmol/L — ABNORMAL LOW (ref 135–145)
Total Bilirubin: 0.8 mg/dL (ref 0.3–1.2)
Total Protein: 7.5 g/dL (ref 6.5–8.1)

## 2022-08-12 LAB — BRAIN NATRIURETIC PEPTIDE: B Natriuretic Peptide: 38.4 pg/mL (ref 0.0–100.0)

## 2022-08-12 LAB — URINALYSIS, ROUTINE W REFLEX MICROSCOPIC
Bacteria, UA: NONE SEEN
Bilirubin Urine: NEGATIVE
Glucose, UA: NEGATIVE mg/dL
Hgb urine dipstick: NEGATIVE
Ketones, ur: NEGATIVE mg/dL
Leukocytes,Ua: NEGATIVE
Nitrite: NEGATIVE
Protein, ur: NEGATIVE mg/dL
Specific Gravity, Urine: 1.024 (ref 1.005–1.030)
pH: 7 (ref 5.0–8.0)

## 2022-08-12 LAB — TROPONIN I (HIGH SENSITIVITY)
Troponin I (High Sensitivity): 4 ng/L (ref <18)
Troponin I (High Sensitivity): 5 ng/L (ref <18)

## 2022-08-12 LAB — RESP PANEL BY RT-PCR (RSV, FLU A&B, COVID)  RVPGX2
Influenza A by PCR: NEGATIVE
Influenza B by PCR: NEGATIVE
Resp Syncytial Virus by PCR: NEGATIVE
SARS Coronavirus 2 by RT PCR: NEGATIVE

## 2022-08-12 LAB — LACTIC ACID, PLASMA: Lactic Acid, Venous: 1.6 mmol/L (ref 0.5–1.9)

## 2022-08-12 LAB — TSH: TSH: 1.369 u[IU]/mL (ref 0.350–4.500)

## 2022-08-12 LAB — LIPASE, BLOOD: Lipase: 34 U/L (ref 11–51)

## 2022-08-12 MED ORDER — HYDROCODONE-ACETAMINOPHEN 5-325 MG PO TABS
1.0000 | ORAL_TABLET | Freq: Once | ORAL | Status: AC
  Administered 2022-08-12: 1 via ORAL
  Filled 2022-08-12: qty 1

## 2022-08-12 MED ORDER — ORAL CARE MOUTH RINSE: 15.0000 mL | OROMUCOSAL | Status: DC | PRN

## 2022-08-12 MED ORDER — TETANUS-DIPHTH-ACELL PERTUSSIS 5-2.5-18.5 LF-MCG/0.5 IM SUSY
0.5000 mL | PREFILLED_SYRINGE | Freq: Once | INTRAMUSCULAR | Status: AC
  Administered 2022-08-12: 0.5 mL via INTRAMUSCULAR
  Filled 2022-08-12: qty 0.5

## 2022-08-12 MED ORDER — LEVOTHYROXINE SODIUM 50 MCG PO TABS
125.0000 ug | ORAL_TABLET | Freq: Every day | ORAL | Status: DC
  Administered 2022-08-13 – 2022-08-14 (×2): 125 ug via ORAL
  Filled 2022-08-12 (×2): qty 1

## 2022-08-12 MED ORDER — FENTANYL CITRATE PF 50 MCG/ML IJ SOSY: 50.0000 ug | PREFILLED_SYRINGE | Freq: Once | INTRAMUSCULAR | Status: DC

## 2022-08-12 MED ORDER — UMECLIDINIUM BROMIDE 62.5 MCG/ACT IN AEPB
1.0000 | INHALATION_SPRAY | Freq: Every day | RESPIRATORY_TRACT | Status: DC
  Administered 2022-08-13 – 2022-08-14 (×2): 1 via RESPIRATORY_TRACT
  Filled 2022-08-12: qty 7

## 2022-08-12 MED ORDER — LORAZEPAM 0.5 MG PO TABS
0.5000 mg | ORAL_TABLET | Freq: Every day | ORAL | Status: DC | PRN
  Administered 2022-08-13: 0.5 mg via ORAL
  Filled 2022-08-12: qty 1

## 2022-08-12 MED ORDER — FENTANYL CITRATE PF 50 MCG/ML IJ SOSY
50.0000 ug | PREFILLED_SYRINGE | Freq: Once | INTRAMUSCULAR | Status: AC
  Administered 2022-08-12: 50 ug via INTRAVENOUS
  Filled 2022-08-12: qty 1

## 2022-08-12 MED ORDER — ACETAMINOPHEN 650 MG RE SUPP: 650.0000 mg | Freq: Four times a day (QID) | RECTAL | Status: DC | PRN

## 2022-08-12 MED ORDER — SODIUM CHLORIDE 0.9% FLUSH
3.0000 mL | Freq: Two times a day (BID) | INTRAVENOUS | Status: DC
  Administered 2022-08-12: 3 mL via INTRAVENOUS

## 2022-08-12 MED ORDER — PRAVASTATIN SODIUM 20 MG PO TABS
40.0000 mg | ORAL_TABLET | Freq: Every day | ORAL | Status: DC
  Administered 2022-08-12 – 2022-08-13 (×2): 40 mg via ORAL
  Filled 2022-08-12 (×2): qty 2

## 2022-08-12 MED ORDER — MELATONIN 5 MG PO TABS
10.0000 mg | ORAL_TABLET | Freq: Every day | ORAL | Status: DC
  Administered 2022-08-12 – 2022-08-13 (×2): 10 mg via ORAL
  Filled 2022-08-12 (×2): qty 2

## 2022-08-12 MED ORDER — BARRIER CREAM NON-SPECIFIED: 1.0000 | TOPICAL_CREAM | Freq: Three times a day (TID) | TOPICAL | Status: DC

## 2022-08-12 MED ORDER — IOHEXOL 300 MG/ML  SOLN
50.0000 mL | Freq: Once | INTRAMUSCULAR | Status: AC | PRN
  Administered 2022-08-12: 50 mL via INTRAVENOUS

## 2022-08-12 MED ORDER — FLUTICASONE FUROATE-VILANTEROL 100-25 MCG/ACT IN AEPB
1.0000 | INHALATION_SPRAY | Freq: Every day | RESPIRATORY_TRACT | Status: DC
  Administered 2022-08-13 – 2022-08-14 (×2): 1 via RESPIRATORY_TRACT
  Filled 2022-08-12: qty 28

## 2022-08-12 MED ORDER — ACETAMINOPHEN 325 MG PO TABS: 650.0000 mg | ORAL_TABLET | Freq: Four times a day (QID) | ORAL | Status: DC | PRN

## 2022-08-12 MED ORDER — ONDANSETRON HCL 4 MG/2ML IJ SOLN
4.0000 mg | Freq: Once | INTRAMUSCULAR | Status: AC
  Administered 2022-08-12: 4 mg via INTRAVENOUS
  Filled 2022-08-12: qty 2

## 2022-08-12 MED ORDER — IOHEXOL 300 MG/ML  SOLN
100.0000 mL | Freq: Once | INTRAMUSCULAR | Status: AC | PRN
  Administered 2022-08-12: 100 mL via INTRAVENOUS

## 2022-08-12 MED ORDER — HYDROCODONE-ACETAMINOPHEN 5-325 MG PO TABS
1.0000 | ORAL_TABLET | Freq: Four times a day (QID) | ORAL | Status: DC | PRN
  Administered 2022-08-13: 1 via ORAL
  Filled 2022-08-12 (×2): qty 1

## 2022-08-12 MED ORDER — LIDOCAINE 5 % EX PTCH
1.0000 | MEDICATED_PATCH | CUTANEOUS | Status: DC
  Administered 2022-08-12 – 2022-08-13 (×2): 1 via TRANSDERMAL
  Filled 2022-08-12 (×2): qty 1

## 2022-08-12 MED ORDER — SODIUM CHLORIDE 0.9 % IV BOLUS (SEPSIS)
1000.0000 mL | Freq: Once | INTRAVENOUS | Status: AC
  Administered 2022-08-12: 1000 mL via INTRAVENOUS

## 2022-08-12 MED ORDER — AMLODIPINE BESYLATE 5 MG PO TABS
2.5000 mg | ORAL_TABLET | Freq: Every day | ORAL | Status: DC
  Administered 2022-08-12 – 2022-08-14 (×3): 2.5 mg via ORAL
  Filled 2022-08-12 (×3): qty 1

## 2022-08-12 NOTE — ED Notes (Signed)
Bladder scan= 275 mL

## 2022-08-12 NOTE — ED Provider Notes (Addendum)
[facility] Provider Note    Event Date/Time   First MD Initiated Contact with Patient 08/12/22 0315     (approximate)   History   Fall and Abdominal Pain   HPI  Madeline Hernandez is a 88 y.o. female with history of dementia, COPD, hypertension, hyperlipidemia who presents to the emergency department with EMS after an unwitnessed fall at her nursing facility.  Patient complaining of lower abdominal pain.  She denies chest pain or shortness of breath.  She is not sure how she fell.  No headache, neck or back pain.  She is on aspirin but no other antiplatelet or anticoagulant.   History provided by patient and EMS but level 5 caveat secondary to patient's dementia.    Past Medical History:  Diagnosis Date   COPD (chronic obstructive pulmonary disease) (HCC)    Dementia ([place])    Hyperlipidemia    Hypertension     No past surgical history on file.  MEDICATIONS:  Prior to Admission medications   Medication Sig Start Date End Date Taking? Authorizing Provider  albuterol (VENTOLIN HFA) 108 (90 Base) MCG/ACT inhaler Inhale 2 puffs into the lungs every 6 (six) hours as needed for wheezing or shortness of breath.    [provider]  amLODipine (NORVASC) 2.5 MG tablet Take 2.5 mg by mouth daily.    [provider]  aspirin 81 MG chewable tablet Chew 81 mg by mouth daily.    [provider]  barrier cream (NON-SPECIFIED) CREA Apply 1 application. topically 3 (three) times daily.    [provider]  calcium carbonate (OSCAL) 1500 (600 Ca) MG TABS tablet Take 1 tablet by mouth daily.    [provider]  Ergocalciferol (VITAMIN D2) 50 MCG (2000 UT) TABS Take 2,000 Int'l Units by mouth daily.    [provider]  Fluticasone-Umeclidin-Vilant (TRELEGY ELLIPTA) 100-62.5-25 MCG/ACT AEPB Inhale 1 puff into the lungs daily.    [provider]  ipratropium-albuterol (DUONEB) 0.5-2.5 (3) MG/3ML SOLN Take 3 mLs by  nebulization every 6 (six) hours as needed (shortness of breath or wheezing).    [provider]  levothyroxine (SYNTHROID) 125 MCG tablet Take 125 mcg by mouth daily before breakfast.    [provider]  lovastatin (MEVACOR) 40 MG tablet Take 40 mg by mouth every evening.    [provider]  magnesium oxide (MAG-OX) 400 MG tablet Take 600 mg by mouth daily.    [provider]  Melatonin 10 MG TABS Take 10 mg by mouth at bedtime.    [provider]  Multiple Vitamins-Minerals (PRESERVISION AREDS 2) CAPS Take 1 capsule by mouth 2 (two) times daily.    [provider]  sertraline (ZOLOFT) 50 MG tablet Take 50 mg by mouth daily.    [provider]  vitamin B-12 (CYANOCOBALAMIN) 1000 MCG tablet Take 1,000 mcg by mouth daily.    [provider]    Physical Exam   Triage Vital Signs: ED Triage Vitals  Enc Vitals Group     BP 08/12/22 0232 (!) 179/101     Pulse Rate 08/12/22 0232 98     Resp 08/12/22 0232 (!) 22     Temp 08/12/22 0232 98.3 F (36.8 C)     Temp src --      SpO2 08/12/22 0232 94 %     Weight 08/12/22 0233 110 lb 3.7 oz (50 kg)     Height --      Head  Circumference --      Peak Flow --      Pain Score --      Pain Loc --      Pain Edu? --      Excl. in [place]? --     Most recent vital signs: Vitals:   08/12/22 0700 08/12/22 0705  BP:    Pulse: 78 76  Resp:    Temp:    SpO2: 92% 91%     CONSTITUTIONAL: Alert and oriented to person only.  Elderly.  Intermittently moaning. HEAD: Normocephalic; small skin tear and contusion to the right forehead EYES: Conjunctivae clear, PERRL, EOMI ENT: normal nose; no rhinorrhea; moist mucous membranes; pharynx without lesions noted; no dental injury; no septal hematoma, no epistaxis; no facial deformity or bony tenderness NECK: Supple, no midline spinal tenderness, step-off or deformity; trachea midline CARD: RRR; S1 and S2 appreciated; no murmurs, no clicks, no  rubs, no gallops RESP: Normal chest excursion without splinting or tachypnea; breath sounds clear and equal bilaterally; no wheezes, no rhonchi, no rales; no hypoxia or respiratory distress CHEST:  chest wall stable, no crepitus or ecchymosis or deformity, nontender to palpation; no flail chest ABD/GI: Normal bowel sounds; non-distended; soft, tender to palpation diffusely, no rebound, no guarding; no ecchymosis or other lesions noted PELVIS:  stable, nontender to palpation BACK:  The back appears normal; no midline spinal tenderness, step-off or deformity EXT: Normal ROM in all joints; non-tender to palpation; no edema; normal capillary refill; no cyanosis, no bony tenderness or bony deformity of patient's extremities, no joint effusion, compartments are soft, extremities are warm and well-perfused, no ecchymosis SKIN: Normal color for age and race; warm NEURO: No facial asymmetry, normal speech, moving all extremities equally  ED Results / Procedures / Treatments   LABS: (all labs ordered are listed, but only abnormal results are displayed) Labs Reviewed  CBC WITH DIFFERENTIAL/PLATELET - Abnormal; Notable for the following components:      Result Value   WBC 12.6 (*)    Neutro Abs 9.4 (*)    All other components within normal limits  COMPREHENSIVE METABOLIC PANEL - Abnormal; Notable for the following components:   Sodium 133 (*)    Glucose, Bld 143 (*)    All other components within normal limits  URINALYSIS, ROUTINE W REFLEX MICROSCOPIC - Abnormal; Notable for the following components:   Color, Urine YELLOW (*)    APPearance CLEAR (*)    All other components within normal limits  RESP PANEL BY RT-PCR (RSV, FLU A&B, COVID)  RVPGX2  LIPASE, BLOOD  LACTIC ACID, PLASMA  BRAIN NATRIURETIC PEPTIDE  TROPONIN I (HIGH SENSITIVITY)  TROPONIN I (HIGH SENSITIVITY)     EKG:  EKG Interpretation  Date/Time:  Tuesday August 12 2022 02:40:15 EST Ventricular Rate:  91 PR  Interval:  122 QRS Duration: 74 QT Interval:  350 QTC Calculation: 430 R Axis:   6 Text Interpretation: Normal sinus rhythm Normal ECG When compared with ECG of 20-Jun-2021 15:28, Criteria for Inferior infarct are no longer Present T wave inversion no longer evident in Inferior leads Confirmed by Pryor Curia [phone]) on 08/12/2022 3:48:17 AM          RADIOLOGY: My personal review and interpretation of imaging: CT scan showed trace subdural without midline shift, right-sided rib fractures without pneumothorax, spinous process fractures.  I have personally reviewed all radiology reports. CT Chest W Contrast  Result Date: 08/12/2022 CLINICAL DATA:  88 year old female with history of trauma from a  fall. EXAM: CT CHEST WITH CONTRAST TECHNIQUE: Multidetector CT imaging of the chest was performed during intravenous contrast administration. RADIATION DOSE REDUCTION: This exam was performed according to the departmental dose-optimization program which includes automated exposure control, adjustment of the mA and/or kV according to patient size and/or use of iterative reconstruction technique. CONTRAST:  53m OMNIPAQUE IOHEXOL 300 MG/ML  SOLN COMPARISON:  Chest CT 08/20/2020. FINDINGS: Cardiovascular: Heart size is normal. There is no significant pericardial fluid, thickening or pericardial calcification. There is aortic atherosclerosis, as well as atherosclerosis of the great vessels of the mediastinum and the coronary arteries, including calcified atherosclerotic plaque in the left main, left anterior descending, left circumflex and right coronary arteries. Mediastinum/Nodes: Prominent borderline enlarged right hilar lymph node measuring 1.4 cm in short axis, slightly smaller than prior study from 06/20/2021, presumably benign. No other pathologically enlarged mediastinal or hilar lymph nodes are noted on today's examination. Small hiatal hernia. No axillary lymphadenopathy. Lungs/Pleura: No acute  consolidative airspace disease. No pleural effusions. Diffuse bronchial wall thickening with moderate to severe centrilobular and paraseptal emphysema. Upper Abdomen: Aortic atherosclerosis. Multiple tiny subcentimeter low-attenuation lesions in both kidneys, too small to definitively characterize, but statistically likely to represent tiny cysts. In addition, there is a 2.6 x 1.6 cm low-attenuation lesion in the interpolar region of the left kidney, compatible with a simple cyst (no imaging follow-up for any of these renal lesions is recommended). Musculoskeletal: No acute displaced fractures in the visualized portions of the skeleton. Compression fracture of superior endplate of L1 with 389%loss of anterior vertebral body height, new compared to the prior study, but nonacute. There are no aggressive appearing lytic or blastic lesions noted in the visualized portions of the skeleton. IMPRESSION: 1. No evidence of significant acute traumatic injury to the thorax. 2. New but nonacute compression fracture of superior endplate of L1 with 338%loss of anterior vertebral body height. 3. Diffuse bronchial wall thickening with moderate to severe centrilobular and paraseptal emphysema; imaging findings suggestive of underlying COPD. 4. Aortic atherosclerosis, in addition to left main and three-vessel coronary artery disease. 5. Small hiatal hernia. Aortic Atherosclerosis (ICD10-I70.0) and Emphysema (ICD10-J43.9). Electronically Signed   By: DVinnie LangtonM.D.   On: 08/12/2022 05:35   CT Cervical Spine Wo Contrast  Result Date: 08/12/2022 CLINICAL DATA:  Fall. EXAM: CT CERVICAL SPINE WITHOUT CONTRAST TECHNIQUE: Multidetector CT imaging of the cervical spine was performed without intravenous contrast. Multiplanar CT image reconstructions were also generated. RADIATION DOSE REDUCTION: This exam was performed according to the departmental dose-optimization program which includes automated exposure control, adjustment of  the mA and/or kV according to patient size and/or use of iterative reconstruction technique. COMPARISON:  11/05/2021 FINDINGS: Alignment: No traumatic malalignment Skull base and vertebrae: No acute fracture. No primary bone lesion or focal pathologic process. Soft tissues and spinal canal: No prevertebral fluid or swelling. No visible canal hematoma. Disc levels:  Generalized degenerative endplate and facet spurring. Upper chest: No acute finding.  Prominent emphysema. IMPRESSION: Negative for cervical spine fracture or subluxation. Electronically Signed   By: JJorje GuildM.D.   On: 08/12/2022 04:11   CT Head Wo Contrast  Result Date: 08/12/2022 CLINICAL DATA:  Initial evaluation for acute trauma. EXAM: CT HEAD WITHOUT CONTRAST TECHNIQUE: Contiguous axial images were obtained from the base of the skull through the vertex without intravenous contrast. RADIATION DOSE REDUCTION: This exam was performed according to the departmental dose-optimization program which includes automated exposure control, adjustment of the mA and/or kV according  to patient size and/or use of iterative reconstruction technique. COMPARISON:  Prior study from 11/05/2021. FINDINGS: Brain: Generalized age-related cerebral atrophy with chronic small vessel ischemic disease. There is a trace acute subdural hemorrhage overlying the anterior right frontal convexity. Hemorrhage measures up to 4 mm in maximal thickness without significant mass effect. No midline shift. No other acute intracranial hemorrhage. No acute large vessel territory infarct. No mass lesion or hydrocephalus. Vascular: No abnormal hyperdense vessel. Scattered vascular calcifications noted within the carotid siphons. Skull: Soft tissue contusion/laceration present at the right periorbital region. Calvarium intact. Sinuses/Orbits: Globes orbital soft tissues demonstrate no acute finding. Small amount of layering secretions noted within the left sphenoid sinus. Visualized  paranasal sinuses are otherwise clear. No mastoid effusion. Other: None. IMPRESSION: 1. Trace acute subdural hemorrhage overlying the anterior right frontal convexity measuring up to 4 mm in maximal thickness without significant mass effect. 2. Soft tissue contusion/laceration at the right periorbital region. No calvarial fracture. 3. Underlying age-related cerebral atrophy with chronic small vessel ischemic disease. Critical Value/emergent results were called by telephone at the time of interpretation on 08/12/2022 at 3:57 am to provider JADE SUNG , who verbally acknowledged these results. Electronically Signed   By: Jeannine Boga M.D.   On: 08/12/2022 03:59   CT Abdomen Pelvis W Contrast  Result Date: 08/12/2022 CLINICAL DATA:  Abdominal pain. EXAM: CT ABDOMEN AND PELVIS WITH CONTRAST TECHNIQUE: Multidetector CT imaging of the abdomen and pelvis was performed using the standard protocol following bolus administration of intravenous contrast. RADIATION DOSE REDUCTION: This exam was performed according to the departmental dose-optimization program which includes automated exposure control, adjustment of the mA and/or kV according to patient size and/or use of iterative reconstruction technique. CONTRAST:  160m OMNIPAQUE IOHEXOL 300 MG/ML  SOLN COMPARISON:  CT abdomen pelvis dated 06/20/2021. FINDINGS: Lower chest: Emphysematous changes of the visualized lung bases. The visualized lung bases are otherwise clear. There is coronary vascular calcification. No intra-abdominal free air or free fluid. Hepatobiliary: The liver is unremarkable. No biliary dilatation. The gallbladder is unremarkable. Pancreas: Unremarkable. No pancreatic ductal dilatation or surrounding inflammatory changes. Spleen: Normal in size without focal abnormality. Adrenals/Urinary Tract: The adrenal glands are unremarkable. There is no hydronephrosis on either side. There is symmetric enhancement of the renal parenchyma bilaterally.  Small bilateral renal cysts measure up to 2.5 cm in the inferior pole of the right kidney. Additional subcentimeter hypodense lesions are too small to characterize. The visualized ureters appear unremarkable. Mild trabeculated appearance of the bladder wall, likely related to chronic bladder dysfunction. Stomach/Bowel: Severe sigmoid diverticulosis and scattered colonic diverticula without active inflammatory changes. There is no bowel obstruction or active inflammation. Appendectomy. Vascular/Lymphatic: Advanced aortoiliac atherosclerotic disease. There is a 2.2 cm infrarenal aortic ectasia. The IVC is unremarkable. No portal venous gas. There is no adenopathy. Reproductive: The uterus is grossly unremarkable. No adnexal masses. Other: Small fat containing right femoral hernia. No bowel herniation. Musculoskeletal: Osteopenia with degenerative changes of the spine and hips. Age indeterminate minimally displaced fractures of the posterior right eleventh and twelfth ribs. Mildly displaced fracture of the right L3 transverse process as well as fracture of tip of the right L1 transverse process. These fractures are new since the prior CT of 06/20/2021. IMPRESSION: 1. Fractures of the posterior right eleventh and twelfth ribs as well as right L1 and L3 transverse processes, new since the prior CT. No other acute intra-abdominal or pelvic pathology. 2. Severe sigmoid diverticulosis. No bowel obstruction. 3.  Aortic Atherosclerosis (ICD10-I70.0).  Electronically Signed   By: Anner Crete M.D.   On: 08/12/2022 03:59   DG Chest 1 View  Result Date: 08/12/2022 CLINICAL DATA:  Shortness of breath and fall EXAM: CHEST  1 VIEW COMPARISON:  11/05/2021 FINDINGS: Stable cardiomediastinal silhouette. Aortic atherosclerotic calcification. Chronic bronchitic change. Increased interstitial coarsening in the lower lungs compared to 11/05/2021. No pleural effusion or pneumothorax. No displaced rib fractures. IMPRESSION: Question  superimposed edema or atypical infection on a background of chronic lung disease. Electronically Signed   By: Placido Sou M.D.   On: 08/12/2022 03:19     PROCEDURES:  Critical Care performed: YES   CRITICAL CARE Performed by: Cyril Mourning Abdo Denault   Total critical care time: 45 minutes  Critical care time was exclusive of separately billable procedures and treating other patients.  Critical care was necessary to treat or prevent imminent or life-threatening deterioration.  Critical care was time spent personally by me on the following activities: development of treatment plan with patient and/or surrogate as well as nursing, discussions with consultants, evaluation of patient's response to treatment, examination of patient, obtaining history from patient or surrogate, ordering and performing treatments and interventions, ordering and review of laboratory studies, ordering and review of radiographic studies, pulse oximetry and re-evaluation of patient's condition.      Marland Kitchen1-3 Lead EKG Interpretation  Performed by: Amando Ishikawa, Delice Bison, DO Authorized by: Syndey Jaskolski, Delice Bison, DO     Interpretation: normal     ECG rate:  81   ECG rate assessment: normal     Rhythm: sinus rhythm     Ectopy: none     Conduction: normal       IMPRESSION / MDM / ASSESSMENT AND PLAN / ED COURSE  I reviewed the triage vital signs and the nursing notes.  Patient here with unwitnessed fall from her nursing facility.  Has obvious signs of head trauma.  Also complaining of abdominal pain and moaning intermittently.  The patient is on the cardiac monitor to evaluate for evidence of arrhythmia and/or significant heart rate changes.   DIFFERENTIAL DIAGNOSIS (includes but not limited to):   Skull fracture, contusion, skin tear, intracranial hemorrhage, diverticulitis, colitis, urinary retention, UTI, appendicitis, bowel obstruction  Patient's presentation is most consistent with acute presentation with potential threat  to life or bodily function.  PLAN: Workup initiated from triage.  Patient has a leukocytosis of 12,000 with left shift.  Normal electrolytes, LFTs and lipase.  Troponin negative.  Lactic normal.  Chest x-ray reviewed and interpreted by myself and the radiologist and is concerning for edema versus atypical infection.  Will add on BNP and obtain respiratory viral panel.  CT of the head and cervical spine, abdomen pelvis pending.   MEDICATIONS GIVEN IN ED: Medications  fentaNYL (SUBLIMAZE) injection 50 mcg (50 mcg Intravenous Given 08/12/22 0327)  ondansetron (ZOFRAN) injection 4 mg (4 mg Intravenous Given 08/12/22 0326)  iohexol (OMNIPAQUE) 300 MG/ML solution 100 mL (100 mLs Intravenous Contrast Given 08/12/22 0340)  Tdap (BOOSTRIX) injection 0.5 mL (0.5 mLs Intramuscular Given 08/12/22 0411)  sodium chloride 0.9 % bolus 1,000 mL (0 mLs Intravenous Stopped 08/12/22 0543)  iohexol (OMNIPAQUE) 300 MG/ML solution 50 mL (50 mLs Intravenous Contrast Given 08/12/22 0507)     ED COURSE: CT scans reviewed and interpreted by myself and the radiologist and shows no acute right-sided rib fractures but no pneumothorax and lumbar spinous process fractures.  No hypoxia with rest.  Complains of no pain.  Urine shows no infection.  CT head  showed small subdural.  She will get a repeat head CT in 6 hours.  Difficult to get full neurologic exam given her dementia but she has no facial asymmetry, normal speech and is moving all of her extremities.  Patient will need ambulatory sat.  If no hypoxia, uncontrolled pain or change in head CT or neurologic status, anticipate discharge home back to her skilled nursing facility.   6:38 AM  Pt refuses to ambulate.  Rechecked room air sats at rest and patient was satting at 87% on room air.  Given new onset hypoxia with rib fractures, will admit for pulmonary toilet.  Pain is currently well-controlled.  Will order incentive spirometry.   CONSULTS: Consulted and discussed  patient's case with hospitalist.   I have recommended admission and consulting physician agrees and will place admission orders.  Patient (and family if present) agree with this plan.   I reviewed all nursing notes, vitals, pertinent previous records.  All labs, EKGs, imaging ordered have been independently reviewed and interpreted by myself.  Secure chat also sent to Dr. Izora Ribas on-call for neurosurgery given patient's lumbar spinal process fractures and trace subdural hematoma.  OUTSIDE RECORDS REVIEWED: Reviewed patient's neurology note with Dr. Manuella Ghazi on 03/04/2022 for Alzheimer's.       FINAL CLINICAL IMPRESSION(S) / ED DIAGNOSES   Final diagnoses:  Fall, initial encounter  Injury of head, initial encounter  SDH (subdural hematoma) (HCC)  Closed fracture of multiple ribs of right side, initial encounter  Closed fracture of spinous process of lumbar vertebra, initial encounter ([place])  Acute respiratory failure with hypoxia ([place])     Rx / DC Orders   ED Discharge Orders     None        Note:  This document was prepared using Dragon voice recognition software and may include unintentional dictation errors.   Braeley Buskey, Delice Bison, DO 08/12/22 0618    Ravis Herne, Delice Bison, DO 08/12/22 [phone]

## 2022-08-12 NOTE — ED Notes (Signed)
Pt to CT

## 2022-08-12 NOTE — ED Provider Triage Note (Signed)
Emergency Medicine Provider Hernandez Evaluation Note  Madeline Hernandez.  Pt complains of fall, lower abdominal pain.  Review of Systems  Positive: Fall, head abrasion, lower abdominal pain Negative: Vomiting/diarrhea  Physical Exam  There were no vitals taken for this visit. Gen:   Awake, moderate distress   Resp:  Normal effort  MSK:   Moves extremities without difficulty  Other:  Head abrasion  Medical Decision Making  Medically screening exam initiated at 2:16 AM.  Appropriate orders placed.  Madeline Hernandez was informed that the remainder of the evaluation will be completed by another provider, this initial Hernandez assessment does not replace that evaluation, and the importance of remaining in the ED until their evaluation is complete.  88 year old female presenting with fall and lower abdominal pain.  Will obtain lab work, CT scans while patient awaits treatment room.   Madeline Blanch, MD 08/12/22 [phone]

## 2022-08-12 NOTE — H&P (Addendum)
History and Physical    Patient: Madeline Hernandez FIE:[id number] DOB: December 08, 1932 DOA: 08/12/2022 DOS: the patient was seen and examined on 08/12/2022 PCP: Bonnita Nasuti, MD  Patient coming from: [facility] care unit via EMS  Chief Complaint:  Chief Complaint  Patient presents with   Fall   Abdominal Pain   HPI: Madeline Hernandez is a 88 y.o. female with medical history significant of hypertension, hyperlipidemia, CVA, COPD, and dementia who presents to having a fall.  At baseline patient can ambulate with assistance with use of a walker but is usually in the bed due to her dementia.  Patient's son notes that 1:30 AM he was notified that the patient had fallen and possibly hit her head against the nightstand because of the bruise she had on her forehead. They think that she had possibly been getting up to use the restroom, but it is not totally clear.  The patient complains of pain on the right side of her back and she is trying to roll over in the hospital gurney.   In the emergency department patient was noted to be afebrile with respirations 17-24, blood pressure was elevated at 179/101, and O2 saturations documented as low as 85% with improvement currently on 2 L of nasal cannula oxygen.  Labs significant for WBC 12.6 and sodium 133.  CT scan of the head revealed a trace acute subdural dural hemorrhage overlying the anterior right frontal convexity measuring 4 mm in thickness without mass effect.  CT scan of the chest, abdomen and pelvis noted posterior right 11th and 12th rib fractures , right L1 and L3 transverse processes fractures, nonacute L1 compression fracture with 30% height loss.  Repeat CT scan of the brain did not note any change in subdural hematoma.  Neurosurgery has been consulted and did not recommend any further workup as repeat imaging was stable.  Recommended patient to resume aspirin in 3 days and did not need further follow-up.  Patient has been given 1 L normal saline IV  fluids Tdap booster, Zofran, fentanyl IV, and hydrocodone 5 mg p.o. Patient was noted to have pulled out 2 IVs and nursing staff.    Review of Systems: unable to review all systems due to the inability of the patient to answer questions. Past Medical History:  Diagnosis Date   COPD (chronic obstructive pulmonary disease) (HCC)    Dementia ([place])    Hyperlipidemia    Hypertension    No past surgical history on file. Social History:  reports that she has never smoked. She has never used smokeless tobacco. She reports that she does not currently use alcohol. She reports that she does not currently use drugs.  Allergies  Allergen Reactions   Penicillins     No family history on file.  Prior to Admission medications   Medication Sig Start Date End Date Taking? Authorizing Provider  albuterol (VENTOLIN HFA) 108 (90 Base) MCG/ACT inhaler Inhale 2 puffs into the lungs every 6 (six) hours as needed for wheezing or shortness of breath.    [provider]  amLODipine (NORVASC) 2.5 MG tablet Take 2.5 mg by mouth daily.    [provider]  aspirin 81 MG chewable tablet Chew 81 mg by mouth daily.    [provider]  barrier cream (NON-SPECIFIED) CREA Apply 1 application. topically 3 (three) times daily.    [provider]  calcium carbonate (OSCAL) 1500 (600 Ca) MG TABS tablet Take 1 tablet by mouth daily.    [provider]  Ergocalciferol (VITAMIN D2) 50 MCG (2000 UT) TABS Take 2,000 Int'l Units by mouth daily.    [provider]  Fluticasone-Umeclidin-Vilant (TRELEGY ELLIPTA) 100-62.5-25 MCG/ACT AEPB Inhale 1 puff into the lungs daily.    [provider]  ipratropium-albuterol (DUONEB) 0.5-2.5 (3) MG/3ML SOLN Take 3 mLs by nebulization every 6 (six) hours as needed (shortness of breath or wheezing).    [provider]  levothyroxine (SYNTHROID) 125 MCG tablet Take 125 mcg by mouth daily before breakfast.    [provider]  lovastatin (MEVACOR) 40 MG tablet Take 40 mg by mouth every evening.    [provider]  magnesium oxide (MAG-OX) 400 MG tablet Take 600 mg by mouth daily.    [provider]  Melatonin 10 MG TABS Take 10 mg by mouth at bedtime.    [provider]  Multiple Vitamins-Minerals (PRESERVISION AREDS 2) CAPS Take 1 capsule by mouth 2 (two) times daily.    [provider]  sertraline (ZOLOFT) 50 MG tablet Take 50 mg by mouth daily.    [provider]  vitamin B-12 (CYANOCOBALAMIN) 1000 MCG tablet Take 1,000 mcg by mouth daily.    [provider]    Physical Exam: Vitals:   08/12/22 0700 08/12/22 0705 08/12/22 0734 08/12/22 1026  BP:   116/74 (!) 141/73  Pulse: 78 76 73 64  Resp:   15 17  Temp:      SpO2: 92% 91% 93% 96%  Weight:       Exam  Constitutional: Elderly female who appears to be in some discomfort. Eyes: PERRL, lids and conjunctivae normal ENMT: Mucous membranes are moist. Posterior pharynx clear of any exudate or lesions.fair dentition. Neck: normal, supple  Respiratory: Decreased overall aeration without significant wheezes or rhonchi appreciated.  Patient on 3 L of nasal cannula oxygen with O2 saturations maintained.   Cardiovascular: Regular rate and rhythm, no murmurs / rubs / gallops. No extremity edema. Abdomen: no tenderness, no masses palpated. No hepatosplenomegaly. Bowel sounds positive.  Musculoskeletal: no clubbing / cyanosis.  Tenderness palpation of the lower right posterior ribs Skin: Contusion with bruising of the right forehead. Neurologic: CN 2-12 grossly intact.   Strength 5/5 in all 4.  Psychiatric: Alert and oriented to person.  Anxious mood.  Data Reviewed:  EKG revealed normal sinus rhythm at 91 bpm.  Reviewed labs, imaging and pertinent records as noted above in HPI  Assessment and Plan: Rib fractures and lumbar spine compression fracture Acute.  Patient was found to have right 11th  and 12th posterior rib fractures as well as L1 compression fracture on CT imaging that had not been seen on previous imaging.  Possibly related to the fall. -Admit to medical telemetry bed -Pulmonary toiletry -Incentive spirometry -Hydrocodone p.o. as needed for pain -Lidocaine patch to right posterior ribs -PT to evaluate and treat  Subdural hematoma secondary to fall Patient presents to having a fall and was found to have an acute subdural dural hemorrhage overlying the anterior right frontal convexity measuring 4 mm in thickness without mass effect.  Neurosurgery evaluated and recommended no need for follow-up after repeat CT scan showed no significant change. -Recommended holding aspirin for 3 days and then can resume -PT to evaluate and treat  Dementia with behavioral disturbance At baseline patient is alert and oriented only to self and usually able to recognize family, but has very poor short-term memory and repeats questions.  Patient had pulled out 2 IVs -Delirium  precautions -Set bed alarm -Safety mittens  History of CVA Patient with history of a subcentimeter chronic infarct within the right cerebellar hemisphere noted on MRI from 11/05/2021. -Hold aspirin due to SDH per neurosurgery recommendations for at least 3 days -Continue statin  Essential hypertension -Continue amlodipine  Hypothyroidism -Check TSH -Continue levothyroxine  Hyperlipidemia -Continue pravastatin  DVT prophylaxis: SCDs Advance Care Planning:   Code Status: Full Code  Consults: Neurosurgery Family Communication: Son updated at bedside  Severity of Illness: The appropriate patient status for this patient is OBSERVATION. Observation status is judged to be reasonable and necessary in order to provide the required intensity of service to ensure the patient's safety. The patient's presenting symptoms, physical exam findings, and initial radiographic and laboratory data in the context of their medical  condition is felt to place them at decreased risk for further clinical deterioration. Furthermore, it is anticipated that the patient will be medically stable for discharge from the hospital within 2 midnights of admission.   Author: Norval Morton, MD 08/12/2022 10:49 AM  For on call review [email or web address].

## 2022-08-12 NOTE — ED Triage Notes (Signed)
Pt from [facility] with fall and abd pain. Pt yelling out in pain. Appears uncomfortable, states had abd pain before she fell and struck her head on nightstand tonight. Pt denies known fever or nausea. Pt with laceration noted to right forehead.

## 2022-08-12 NOTE — ED Notes (Signed)
Attempted to ambulate patient to check oxygen sats while walking, pt refuses to walk. Pt states "I don't have to and I don't want to"

## 2022-08-12 NOTE — ED Notes (Signed)
Patient transported to CT 

## 2022-08-12 NOTE — Consult Note (Signed)
Consult requested by:  Dr. Leonides Schanz  Consult requested for:  Intracranial hemorrhage  Primary Physician:  Bonnita Nasuti, MD  History of Present Illness: 08/12/2022 [name] is here today after a fall.  She hit her head.  She does not have a clear loss of consciousness, but is not a good historian.  She is having some abdominal pain.  She denies any complaints neurologically at this time.  Per her son, she has advanced dementia and is normally oriented to self and might know where she is.  He reports that she is at her baseline.    I have utilized the care everywhere function in epic to review the outside records available from external health systems.  Review of Systems:  A 10 point review of systems is negative, except for the pertinent positives and negatives detailed in the HPI.  Past Medical History: Past Medical History:  Diagnosis Date   COPD (chronic obstructive pulmonary disease) ([place])    Dementia ([place])    Hyperlipidemia    Hypertension     Past Surgical History: No past surgical history on file.  Allergies: Allergies as of 08/12/2022 - Review Complete 08/12/2022  Allergen Reaction Noted   Penicillins  06/20/2021    Medications: No outpatient medications have been marked as taking for the 08/12/22 encounter [facility] Encounter).    Social History: Social History   Tobacco Use   Smoking status: Never   Smokeless tobacco: Never  Vaping Use   Vaping Use: Never used  Substance Use Topics   Alcohol use: Not Currently   Drug use: Not Currently    Family Medical History: No family history on file.  Physical Examination: Vitals:   08/12/22 0705 08/12/22 0734  BP:  116/74  Pulse: 76 73  Resp:  15  Temp:    SpO2: 91% 93%    General: Patient is well developed, well nourished, calm, collected, and in no apparent distress. Attention to examination is appropriate.  Neck:   Supple.  Full range of motion.  Respiratory: Patient is breathing  without any difficulty.   NEUROLOGICAL:     Awake, alert, oriented to person, place, but not time.  Speech is clear and fluent.  Cranial Nerves: Pupils equal round and reactive to light.  Facial tone is symmetric.  Facial sensation is symmetric. Shoulder shrug is symmetric. Tongue protrusion is midline.  There is no pronator drift.  Strength: Side Biceps Triceps Deltoid Interossei Grip Wrist Ext. Wrist Flex.  R '5 5 5 5 5 5 5  '$ L '5 5 5 5 5 5 5   '$ Side Iliopsoas Quads Hamstring PF DF EHL  R '5 5 5 5 5 5  '$ L '5 5 5 5 5 5   '$ Hoffman's is absent.   Bilateral upper and lower extremity sensation is intact to light touch.    No evidence of dysmetria noted.  Gait is untested.     Medical Decision Making  Imaging: CT Head 08/12/2022 IMPRESSION: Trace amount of subdural hemorrhage overlying the right frontal convexity measuring up to 4 mm in maximal thickness, unchanged from prior. No associated mass effect or midline shift.     Electronically Signed   By: Davina Poke D.O.   On: 08/12/2022 10:13    I have personally reviewed the images and agree with the above interpretation.  Assessment and Plan: Madeline Hernandez is a pleasant 88 y.o. female with mild traumatic brain injury with a small amount of intracranial hemorrhage in the  subdural space that is stable on repeat CT scan.  She is cleared from my standpoint given the stable findings on CT.  She is cleared to resume aspirin in 3 days.  She does not need to follow up.  I recommended against AEDs due to risk of behavioral disturbance and polypharmacy.   I have communicated my recommendations to the requesting physician and coordinated care to facilitate these recommendations.     Madeline Hernandez K. Izora Ribas MD, [facility] Neurosurgery

## 2022-08-12 NOTE — ED Notes (Signed)
Pt's oxygen sats 87-88% on room air

## 2022-08-12 NOTE — ED Provider Notes (Addendum)
Emergency department handoff note  Care of this patient was signed out to me at the end of the previous provider shift.  All pertinent patient information was conveyed and all questions were answered.  Patient pending repeat head CT showing no change in patient's small subdural hematoma.  I spoke to Dr. Cari Caraway who, due to extremely small size of this subdural hematoma, will not need to see patient in follow-up.  Given patient's bilateral posterior rib fractures and hypoxia requiring 2 L supplemental oxygenation, patient will require admission to the internal medicine service for further evaluation and management.  Dispo: Admit to medicine   Naaman Plummer, MD 08/12/22 1312    Naaman Plummer, MD 08/12/22 [phone]

## 2022-08-12 NOTE — ED Notes (Addendum)
Family in room states that pt will take IV out if we try to start another one. MD made aware, awaiting orders.

## 2022-08-12 NOTE — ED Notes (Signed)
Pt from [facility] with fall, per ems struck head on night stand, unknown loc. No blood thinners per ems. Per ems vitals signs not obtained, per ems pt also has abd pain.

## 2022-08-13 DIAGNOSIS — S065XAA Traumatic subdural hemorrhage with loss of consciousness status unknown, initial encounter: Secondary | ICD-10-CM | POA: Diagnosis not present

## 2022-08-13 DIAGNOSIS — S2241XA Multiple fractures of ribs, right side, initial encounter for closed fracture: Secondary | ICD-10-CM | POA: Diagnosis not present

## 2022-08-13 DIAGNOSIS — S0990XA Unspecified injury of head, initial encounter: Secondary | ICD-10-CM

## 2022-08-13 DIAGNOSIS — W19XXXA Unspecified fall, initial encounter: Secondary | ICD-10-CM | POA: Diagnosis not present

## 2022-08-13 LAB — BASIC METABOLIC PANEL
Anion gap: 8 (ref 5–15)
BUN: 17 mg/dL (ref 8–23)
CO2: 27 mmol/L (ref 22–32)
Calcium: 9 mg/dL (ref 8.9–10.3)
Chloride: 100 mmol/L (ref 98–111)
Creatinine, Ser: 0.67 mg/dL (ref 0.44–1.00)
GFR, Estimated: >60 mL/min (ref >60)
Glucose, Bld: 113 mg/dL — ABNORMAL HIGH (ref 70–99)
Potassium: 3.8 mmol/L (ref 3.5–5.1)
Sodium: 135 mmol/L (ref 135–145)

## 2022-08-13 LAB — CBC
HCT: 35.3 % — ABNORMAL LOW (ref 36.0–46.0)
Hemoglobin: 11.7 g/dL — ABNORMAL LOW (ref 12.0–15.0)
MCH: 30.1 pg (ref 26.0–34.0)
MCHC: 33.1 g/dL (ref 30.0–36.0)
MCV: 90.7 fL (ref 80.0–100.0)
Platelets: 211 10*3/uL (ref 150–400)
RBC: 3.89 MIL/uL (ref 3.87–5.11)
RDW: 13.6 % (ref 11.5–15.5)
WBC: 5.8 10*3/uL (ref 4.0–10.5)
nRBC: 0 % (ref 0.0–0.2)

## 2022-08-13 MED ORDER — FLUOXETINE HCL 10 MG PO CAPS
10.0000 mg | ORAL_CAPSULE | Freq: Every day | ORAL | Status: DC
  Administered 2022-08-13 – 2022-08-14 (×2): 10 mg via ORAL
  Filled 2022-08-13 (×2): qty 1

## 2022-08-13 MED ORDER — HYDROCODONE-ACETAMINOPHEN 5-325 MG PO TABS: 1.0000 | ORAL_TABLET | Freq: Four times a day (QID) | ORAL | Status: DC | PRN

## 2022-08-13 MED ORDER — KETOROLAC TROMETHAMINE 15 MG/ML IJ SOLN: 15.0000 mg | Freq: Four times a day (QID) | INTRAMUSCULAR | Status: DC | PRN

## 2022-08-13 MED ORDER — OXYCODONE HCL 5 MG PO TABS
2.5000 mg | ORAL_TABLET | ORAL | Status: DC | PRN
  Administered 2022-08-13 – 2022-08-14 (×2): 2.5 mg via ORAL
  Filled 2022-08-13 (×2): qty 1

## 2022-08-13 MED ORDER — KETOROLAC TROMETHAMINE 15 MG/ML IJ SOLN: 15.0000 mg | Freq: Two times a day (BID) | INTRAMUSCULAR | Status: DC

## 2022-08-13 MED ORDER — ACETAMINOPHEN 325 MG PO TABS
650.0000 mg | ORAL_TABLET | Freq: Two times a day (BID) | ORAL | Status: DC
  Administered 2022-08-13 – 2022-08-14 (×3): 650 mg via ORAL
  Filled 2022-08-13 (×3): qty 2

## 2022-08-13 NOTE — Evaluation (Signed)
Physical Therapy Evaluation Patient Details Name: Madeline Hernandez MRN: [id number] DOB: [birth year] Today's Date: 08/13/2022  History of Present Illness  Madeline Hernandez is a 88 y.o. female with medical history significant of hypertension, hyperlipidemia, CVA, COPD, and dementia who presents to having a fall.  At baseline patient can ambulate with assistance with use of a walker but is usually in the bed due to her dementia.  Patient's son notes that 1:30 AM he was notified that the patient had fallen and possibly hit her head against the nightstand because of the bruise she had on her forehead. They think that she had possibly been getting up to use the restroom, but it is not totally clear.  The patient complains of pain on the right side of her back. Imaging reveals trace amount of subdural hemorrhage overlying the right frontal  convexity measuring up to 4 mm in maximal thickness. Age indeterminate minimally displaced fractures of the  posterior right eleventh and twelfth ribs. Mildly displaced fracture of the right L3 transverse process as well as fracture of tip of the right L1 transverse process.   Clinical Impression  Patient received in bed, finishing breakfast. She is pleasantly confused and agreeable to PT assessment. Son present for session and provided some history. Patient requires min A for bed mobility due to rib pain. Min guard for transfers and ambulation with RW. She will continue to benefit from skilled PT to improve functional independence and strength.        Recommendations for follow up therapy are one component of a multi-disciplinary discharge planning process, led by the attending physician.  Recommendations may be updated based on patient status, additional functional criteria and insurance authorization.  Follow Up Recommendations Home health PT      Assistance Recommended at Discharge Frequent or constant Supervision/Assistance  Patient can return home with the  following  A little help with walking and/or transfers;A little help with bathing/dressing/bathroom;Assist for transportation;Direct supervision/assist for medications management    Equipment Recommendations None recommended by PT  Recommendations for Other Services       Functional Status Assessment Patient has had a recent decline in their functional status and demonstrates the ability to make significant improvements in function in a reasonable and predictable amount of time.     Precautions / Restrictions Precautions Precautions: Fall Restrictions Weight Bearing Restrictions: No      Mobility  Bed Mobility Overal bed mobility: Needs Assistance Bed Mobility: Supine to Sit, Sit to Supine     Supine to sit: Min assist, HOB elevated Sit to supine: Min assist, HOB elevated   General bed mobility comments: Min assist- LE pain (Chronic), R side pain with bed mobility    Transfers Overall transfer level: Needs assistance Equipment used: Rolling walker (2 wheels) Transfers: Sit to/from Stand Sit to Stand: Min guard                Ambulation/Gait Ambulation/Gait assistance: Min guard Gait Distance (Feet): 50 Feet Assistive device: Rolling walker (2 wheels) Gait Pattern/deviations: Step-through pattern, Decreased step length - right, Decreased step length - left, Trunk flexed Gait velocity: decr     General Gait Details: pain with ambulation due to rib fractures. Min guard for safety, cues for walker use ( she uses rollator at baseline)  Financial trader [employer] (Stroke Patients Only)       Balance Overall balance assessment: Needs assistance, History of Falls  Sitting-balance support: Feet supported Sitting balance-Leahy Scale: Good     Standing balance support: Bilateral upper extremity supported, During functional activity, Reliant on assistive device for balance Standing balance-Leahy Scale: Fair Standing balance  comment: reliant on AD, pain                             Pertinent Vitals/Pain Pain Assessment Pain Assessment: Faces Faces Pain Scale: Hurts even more Breathing: normal Negative Vocalization: occasional moan/groan, low speech, negative/disapproving quality Facial Expression: facial grimacing Body Language: tense, distressed pacing, fidgeting Consolability: distracted or reassured by voice/touch PAINAD Score: 5 Pain Location: R side/ribs Pain Descriptors / Indicators: Discomfort, Grimacing, Guarding, Moaning, Sore Pain Intervention(s): Monitored during session, Repositioned    Home Living Family/patient expects to be discharged to:: Assisted living                 Home Equipment: Rollator (4 wheels) Additional Comments: patient lives in memory care per son    Prior Function Prior Level of Function : Needs assist;History of Falls (last six months)             Mobility Comments: ambulates with rollator at baseline, per son she does not get around much ADLs Comments: requires Assistance     Hand Dominance        Extremity/Trunk Assessment   Upper Extremity Assessment Upper Extremity Assessment: Generalized weakness    Lower Extremity Assessment Lower Extremity Assessment: Generalized weakness    Cervical / Trunk Assessment Cervical / Trunk Assessment: Kyphotic  Communication   Communication: No difficulties  Cognition Arousal/Alertness: Awake/alert Behavior During Therapy: WFL for tasks assessed/performed Overall Cognitive Status: History of cognitive impairments - at baseline                                 General Comments: asks repeatedly why she hurts, what happened, where she is.        General Comments      Exercises     Assessment/Plan    PT Assessment Patient needs continued PT services  PT Problem List Decreased strength;Decreased activity tolerance;Decreased balance;Decreased mobility;Decreased safety  awareness;Decreased knowledge of use of DME;Decreased cognition;Cardiopulmonary status limiting activity;Pain       PT Treatment Interventions Gait training;Functional mobility training;Therapeutic activities;Patient/family education    PT Goals (Current goals can be found in the Care Plan section)  Acute Rehab PT Goals Patient Stated Goal: to return to [facility] with HHPT PT Goal Formulation: With family Time For Goal Achievement: 08/28/22 Potential to Achieve Goals: Good    Frequency Min 2X/week     Co-evaluation               AM-PAC PT "6 Clicks" Mobility  Outcome Measure Help needed turning from your back to your side while in a flat bed without using bedrails?: A Little Help needed moving from lying on your back to sitting on the side of a flat bed without using bedrails?: A Little Help needed moving to and from a bed to a chair (including a wheelchair)?: A Little Help needed standing up from a chair using your arms (e.g., wheelchair or bedside chair)?: A Little Help needed to walk in hospital room?: A Little Help needed climbing 3-5 steps with a railing? : A Lot 6 Click Score: 17    End of Session Equipment Utilized During Treatment: Gait belt;Oxygen Activity Tolerance: Patient limited by pain  Patient left: in bed;with call bell/phone within reach;with bed alarm set;with family/visitor present Nurse Communication: Mobility status PT Visit Diagnosis: Unsteadiness on feet (R26.81);Other abnormalities of gait and mobility (R26.89);Muscle weakness (generalized) (M62.81);History of falling (Z91.81);Difficulty in walking, not elsewhere classified (R26.2);Pain Pain - Right/Left: Right Pain - part of body:  (Trunk/ribs)    Time: 1015-1040 PT Time Calculation (min) (ACUTE ONLY): 25 min   Charges:   PT Evaluation $PT Eval Moderate Complexity: 1 Mod PT Treatments $Gait Training: 8-22 mins        Treyshawn Muldrew, PT, GCS 08/13/22,11:13 AM

## 2022-08-13 NOTE — Progress Notes (Addendum)
CSW spoke with [employer] @ [facility] to inform her that pt to discharge tomorrow.  Inquired about transport back to facility as pt's son states that he doesn't think that he can safely transport her back  Amber stated that she will reach out to transportation about picking pt up around lunch time.  She will confirm tomorrow.   MD to place hospice home health orders in discharge packet for facility to evaluate and treat once pt returns to [facility].  3:16   Amber from [facility] called back. They will pick pt up at lunch time to transport her back to the facility.

## 2022-08-13 NOTE — Progress Notes (Signed)
Patient once again removed IV and is refusing a second. NP Foust notified via secure chat. Patient has not voided since arrival to unit; bladder scan shows 132m.

## 2022-08-13 NOTE — Progress Notes (Signed)
[facility] PROGRESS NOTE  Madeline Hernandez QXI:[id number] DOB: [birth year] DOA: 08/12/2022 PCP: Bonnita Nasuti, MD  Status; Remains inpatient appropriate because: Need to titrate narcotic pain medications in an elderly patient on chronic oxygen with rib fractures and increased risk for pneumonia and hypoventilation.   Level of care: Telemetry Medical   Code Status: Full code Family Communication: Son by telephone DVT prophylaxis: SCDs in context of tiny acute SDH from fall   HPI: 88 year old female resident of [facility] memory care unit with a past medical history of hypertension, dyslipidemia, CVA, COPD on chronic oxygen at 2 L/min and dementia.  She presented after mechanical fall.  Patient was brought to the ER where she was found to have tiny subdural hematoma on initial CT of the head.  She was also found to have posterior right 11th and 12th rib fractures that are causing increased pain and splinting respiratory effort.  There was also an incidental finding of a nonacute L1 compression fracture with 30% height loss.  Neurosurgery was consulted and recommended no further workup other than holding aspirin and repeat imaging that was subsequently stable.  Patient was initially mildly agitated in regards to her dementia but has subsequently stabilized.  Subjective: Patient is alert and pleasantly confused.  When asked she told me that she lives alone and that she did not wear oxygen.  In discussing with her son history clarified.  Patient was complaining of some pain with breathing and she felt like she may not be taking breaths deep enough but she denied shortness of breath.  Objective: Vitals:   08/12/22 1858 08/13/22 0000  BP: (!) 160/83 (!) 141/72  Pulse: 73 84  Resp: 16 20  Temp: 98.2 F (36.8 C) 98.8 F (37.1 C)  SpO2: 92% 94%    Intake/Output Summary (Last 24 hours) at 08/13/2022 0849 Last data filed at 08/12/2022 2127 Gross per 24 hour  Intake 3 ml  Output --   Net 3 ml   Filed Weights   08/12/22 0233  Weight: 50 kg    Exam:  Constitutional: NAD, calm, comfortable Respiratory: clear to auscultation bilaterally, no wheezing, no crackles. Normal respiratory effort. No accessory muscle use.  Decreased respiratory effort.  2 L nasal cannula oxygen.  Tender right lateral chest with minimal palpation to lower rib cage area. Cardiovascular: Regular rate and rhythm, no murmurs / rubs / gallops. No extremity edema. 2+ pedal pulses.  Abdomen: no tenderness, no masses palpated. No hepatosplenomegaly. Bowel sounds positive.  Musculoskeletal: no clubbing / cyanosis. No joint deformity upper and lower extremities. Good ROM, no contractures. Normal muscle tone.  Skin: no rashes, lesions, ulcers. No induration Neurologic: CN 2-12 grossly intact. Sensation intact,  Strength 5/5 x all 4 extremities.  Psychiatric: Alert and oriented x name. Normal mood.    Assessment/Plan: Rib fractures and lumbar spine compression fracture Acute fractures of the 11th and 12th ribs as noted above as well as an incidental finding of an age-indeterminate L1 compression fracture Patient primarily complaining of left lateral chest discomfort Have initiated low-dose Oxy IR 2.5 mg every 4 hours as needed for pain-will need to titrate to appropriate dose prior to discharge since patient is a resident of an assisted living facility and will need correct dosage known prior to discharge Given one-time dose of Toradol but overall will need to hold aspirin products for the next 3 days per recommendation of neurosurgery PT recommended home health PT.  Patient able to ambulate with a rolling walker but according to  her son she was still having difficulty getting up and down from the chair and/or the bed due to rib pain   Subdural hematoma secondary to fall But after mechanical fall - found to have an acute subdural dural hemorrhage overlying the anterior right frontal convexity measuring 4 mm  in thickness without mass effect.  Neurosurgery evaluated and recommended no need for follow-up after repeat CT scan showed no significant change. -Holding aspirin for 3 days and then can resume -PT recommendations as above   Dementia with behavioral disturbance Patient resides at memory care unit prior to admission   History of CVA Aspirin on hold as above, continue statin   Essential hypertension Continue amlodipine   Hypothyroidism -TSH normal so we will continue preadmission dose of levothyroxine   Hyperlipidemia -Continue pravastatin    Data Reviewed: Basic Metabolic Panel: Recent Labs  Lab 08/12/22 0225 08/13/22 0337  NA 133* 135  K 3.7 3.8  CL 98 100  CO2 24 27  GLUCOSE 143* 113*  BUN 15 17  CREATININE 0.64 0.67  CALCIUM 9.1 9.0   Liver Function Tests: Recent Labs  Lab 08/12/22 0225  AST 22  ALT 15  ALKPHOS 58  BILITOT 0.8  PROT 7.5  ALBUMIN 4.2   Recent Labs  Lab 08/12/22 0225  LIPASE 34   No results for input(s): "AMMONIA" in the last 168 hours. CBC: Recent Labs  Lab 08/12/22 0225 08/13/22 0337  WBC 12.6* 5.8  NEUTROABS 9.4*  --   HGB 13.7 11.7*  HCT 41.4 35.3*  MCV 90.8 90.7  PLT 230 211   Cardiac Enzymes: No results for input(s): "CKTOTAL", "CKMB", "CKMBINDEX", "TROPONINI" in the last 168 hours. BNP (last 3 results) Recent Labs    08/12/22 0225  BNP 38.4    ProBNP (last 3 results) No results for input(s): "PROBNP" in the last 8760 hours.  CBG: No results for input(s): "GLUCAP" in the last 168 hours.  Recent Results (from the past 240 hour(s))  Resp panel by RT-PCR (RSV, Flu A&B, Covid) Anterior Nasal Swab     Status: None   Collection Time: 08/12/22  3:29 AM   Specimen: Anterior Nasal Swab  Result Value Ref Range Status   SARS Coronavirus 2 by RT PCR NEGATIVE NEGATIVE Final    Comment: (NOTE) SARS-CoV-2 target nucleic acids are NOT DETECTED.  The SARS-CoV-2 RNA is generally detectable in upper respiratory specimens  during the acute phase of infection. The lowest concentration of SARS-CoV-2 viral copies this assay can detect is 138 copies/mL. A negative result does not preclude SARS-Cov-2 infection and should not be used as the sole basis for treatment or other patient management decisions. A negative result may occur with  improper specimen collection/handling, submission of specimen other than nasopharyngeal swab, presence of viral mutation(s) within the areas targeted by this assay, and inadequate number of viral copies(<138 copies/mL). A negative result must be combined with clinical observations, patient history, and epidemiological information. The expected result is Negative.  Fact Sheet for Patients:  EntrepreneurPulse.com.au  Fact Sheet for Healthcare Providers:  IncredibleEmployment.be  This test is no t yet approved or cleared by the [place] FDA and  has been authorized for detection and/or diagnosis of SARS-CoV-2 by FDA under an Emergency Use Authorization (EUA). This EUA will remain  in effect (meaning this test can be used) for the duration of the COVID-19 declaration under Section 564(b)(1) of the Act, 21 U.S.C.section 360bbb-3(b)(1), unless the authorization is terminated  or revoked sooner.  Influenza A by PCR NEGATIVE NEGATIVE Final   Influenza B by PCR NEGATIVE NEGATIVE Final    Comment: (NOTE) The Xpert Xpress SARS-CoV-2/FLU/RSV plus assay is intended as an aid in the diagnosis of influenza from Nasopharyngeal swab specimens and should not be used as a sole basis for treatment. Nasal washings and aspirates are unacceptable for Xpert Xpress SARS-CoV-2/FLU/RSV testing.  Fact Sheet for Patients: EntrepreneurPulse.com.au  Fact Sheet for Healthcare Providers: IncredibleEmployment.be  This test is not yet approved or cleared by the [place] FDA and has been authorized for detection  and/or diagnosis of SARS-CoV-2 by FDA under an Emergency Use Authorization (EUA). This EUA will remain in effect (meaning this test can be used) for the duration of the COVID-19 declaration under Section 564(b)(1) of the Act, 21 U.S.C. section 360bbb-3(b)(1), unless the authorization is terminated or revoked.     Resp Syncytial Virus by PCR NEGATIVE NEGATIVE Final    Comment: (NOTE) Fact Sheet for Patients: EntrepreneurPulse.com.au  Fact Sheet for Healthcare Providers: IncredibleEmployment.be  This test is not yet approved or cleared by the [place] FDA and has been authorized for detection and/or diagnosis of SARS-CoV-2 by FDA under an Emergency Use Authorization (EUA). This EUA will remain in effect (meaning this test can be used) for the duration of the COVID-19 declaration under Section 564(b)(1) of the Act, 21 U.S.C. section 360bbb-3(b)(1), unless the authorization is terminated or revoked.  Performed at [facility], [address] [place], [place] [address]      Studies: CT HEAD WO CONTRAST (5MM)  Result Date: 08/12/2022 CLINICAL DATA:  Follow-up subdural hemorrhage EXAM: CT HEAD WITHOUT CONTRAST TECHNIQUE: Contiguous axial images were obtained from the base of the skull through the vertex without intravenous contrast. RADIATION DOSE REDUCTION: This exam was performed according to the departmental dose-optimization program which includes automated exposure control, adjustment of the mA and/or kV according to patient size and/or use of iterative reconstruction technique. COMPARISON:  08/12/2022 at 0341 hours FINDINGS: Brain: Trace amount of subdural hemorrhage overlying the right frontal convexity measuring up to 4 mm in maximal thickness, unchanged from prior. No associated mass effect or midline shift. No new sites of intracranial hemorrhage. No large territory acute infarction. Patchy low-density changes within the  periventricular and subcortical white matter compatible with chronic microvascular ischemic change. Mild-moderate diffuse cerebral volume loss. Vascular: Atherosclerotic calcifications involving the large vessels of the skull base. No unexpected hyperdense vessel. Skull: Negative for calvarial fracture. Sinuses/Orbits: No acute finding. Other: None. IMPRESSION: Trace amount of subdural hemorrhage overlying the right frontal convexity measuring up to 4 mm in maximal thickness, unchanged from prior. No associated mass effect or midline shift. Electronically Signed   By: Davina Poke D.O.   On: 08/12/2022 10:13   CT Chest W Contrast  Result Date: 08/12/2022 CLINICAL DATA:  88 year old female with history of trauma from a fall. EXAM: CT CHEST WITH CONTRAST TECHNIQUE: Multidetector CT imaging of the chest was performed during intravenous contrast administration. RADIATION DOSE REDUCTION: This exam was performed according to the departmental dose-optimization program which includes automated exposure control, adjustment of the mA and/or kV according to patient size and/or use of iterative reconstruction technique. CONTRAST:  21m OMNIPAQUE IOHEXOL 300 MG/ML  SOLN COMPARISON:  Chest CT 08/20/2020. FINDINGS: Cardiovascular: Heart size is normal. There is no significant pericardial fluid, thickening or pericardial calcification. There is aortic atherosclerosis, as well as atherosclerosis of the great vessels of the mediastinum and the coronary arteries, including calcified atherosclerotic plaque in the  left main, left anterior descending, left circumflex and right coronary arteries. Mediastinum/Nodes: Prominent borderline enlarged right hilar lymph node measuring 1.4 cm in short axis, slightly smaller than prior study from 06/20/2021, presumably benign. No other pathologically enlarged mediastinal or hilar lymph nodes are noted on today's examination. Small hiatal hernia. No axillary lymphadenopathy. Lungs/Pleura:  No acute consolidative airspace disease. No pleural effusions. Diffuse bronchial wall thickening with moderate to severe centrilobular and paraseptal emphysema. Upper Abdomen: Aortic atherosclerosis. Multiple tiny subcentimeter low-attenuation lesions in both kidneys, too small to definitively characterize, but statistically likely to represent tiny cysts. In addition, there is a 2.6 x 1.6 cm low-attenuation lesion in the interpolar region of the left kidney, compatible with a simple cyst (no imaging follow-up for any of these renal lesions is recommended). Musculoskeletal: No acute displaced fractures in the visualized portions of the skeleton. Compression fracture of superior endplate of L1 with 71% loss of anterior vertebral body height, new compared to the prior study, but nonacute. There are no aggressive appearing lytic or blastic lesions noted in the visualized portions of the skeleton. IMPRESSION: 1. No evidence of significant acute traumatic injury to the thorax. 2. New but nonacute compression fracture of superior endplate of L1 with 69% loss of anterior vertebral body height. 3. Diffuse bronchial wall thickening with moderate to severe centrilobular and paraseptal emphysema; imaging findings suggestive of underlying COPD. 4. Aortic atherosclerosis, in addition to left main and three-vessel coronary artery disease. 5. Small hiatal hernia. Aortic Atherosclerosis (ICD10-I70.0) and Emphysema (ICD10-J43.9). Electronically Signed   By: Vinnie Langton M.D.   On: 08/12/2022 05:35   CT Cervical Spine Wo Contrast  Result Date: 08/12/2022 CLINICAL DATA:  Fall. EXAM: CT CERVICAL SPINE WITHOUT CONTRAST TECHNIQUE: Multidetector CT imaging of the cervical spine was performed without intravenous contrast. Multiplanar CT image reconstructions were also generated. RADIATION DOSE REDUCTION: This exam was performed according to the departmental dose-optimization program which includes automated exposure control,  adjustment of the mA and/or kV according to patient size and/or use of iterative reconstruction technique. COMPARISON:  11/05/2021 FINDINGS: Alignment: No traumatic malalignment Skull base and vertebrae: No acute fracture. No primary bone lesion or focal pathologic process. Soft tissues and spinal canal: No prevertebral fluid or swelling. No visible canal hematoma. Disc levels:  Generalized degenerative endplate and facet spurring. Upper chest: No acute finding.  Prominent emphysema. IMPRESSION: Negative for cervical spine fracture or subluxation. Electronically Signed   By: Jorje Guild M.D.   On: 08/12/2022 04:11   CT Head Wo Contrast  Result Date: 08/12/2022 CLINICAL DATA:  Initial evaluation for acute trauma. EXAM: CT HEAD WITHOUT CONTRAST TECHNIQUE: Contiguous axial images were obtained from the base of the skull through the vertex without intravenous contrast. RADIATION DOSE REDUCTION: This exam was performed according to the departmental dose-optimization program which includes automated exposure control, adjustment of the mA and/or kV according to patient size and/or use of iterative reconstruction technique. COMPARISON:  Prior study from 11/05/2021. FINDINGS: Brain: Generalized age-related cerebral atrophy with chronic small vessel ischemic disease. There is a trace acute subdural hemorrhage overlying the anterior right frontal convexity. Hemorrhage measures up to 4 mm in maximal thickness without significant mass effect. No midline shift. No other acute intracranial hemorrhage. No acute large vessel territory infarct. No mass lesion or hydrocephalus. Vascular: No abnormal hyperdense vessel. Scattered vascular calcifications noted within the carotid siphons. Skull: Soft tissue contusion/laceration present at the right periorbital region. Calvarium intact. Sinuses/Orbits: Globes orbital soft tissues demonstrate no acute finding. Small amount  of layering secretions noted within the left sphenoid  sinus. Visualized paranasal sinuses are otherwise clear. No mastoid effusion. Other: None. IMPRESSION: 1. Trace acute subdural hemorrhage overlying the anterior right frontal convexity measuring up to 4 mm in maximal thickness without significant mass effect. 2. Soft tissue contusion/laceration at the right periorbital region. No calvarial fracture. 3. Underlying age-related cerebral atrophy with chronic small vessel ischemic disease. Critical Value/emergent results were called by telephone at the time of interpretation on 08/12/2022 at 3:57 am to provider JADE SUNG , who verbally acknowledged these results. Electronically Signed   By: Jeannine Boga M.D.   On: 08/12/2022 03:59   CT Abdomen Pelvis W Contrast  Result Date: 08/12/2022 CLINICAL DATA:  Abdominal pain. EXAM: CT ABDOMEN AND PELVIS WITH CONTRAST TECHNIQUE: Multidetector CT imaging of the abdomen and pelvis was performed using the standard protocol following bolus administration of intravenous contrast. RADIATION DOSE REDUCTION: This exam was performed according to the departmental dose-optimization program which includes automated exposure control, adjustment of the mA and/or kV according to patient size and/or use of iterative reconstruction technique. CONTRAST:  116m OMNIPAQUE IOHEXOL 300 MG/ML  SOLN COMPARISON:  CT abdomen pelvis dated 06/20/2021. FINDINGS: Lower chest: Emphysematous changes of the visualized lung bases. The visualized lung bases are otherwise clear. There is coronary vascular calcification. No intra-abdominal free air or free fluid. Hepatobiliary: The liver is unremarkable. No biliary dilatation. The gallbladder is unremarkable. Pancreas: Unremarkable. No pancreatic ductal dilatation or surrounding inflammatory changes. Spleen: Normal in size without focal abnormality. Adrenals/Urinary Tract: The adrenal glands are unremarkable. There is no hydronephrosis on either side. There is symmetric enhancement of the renal  parenchyma bilaterally. Small bilateral renal cysts measure up to 2.5 cm in the inferior pole of the right kidney. Additional subcentimeter hypodense lesions are too small to characterize. The visualized ureters appear unremarkable. Mild trabeculated appearance of the bladder wall, likely related to chronic bladder dysfunction. Stomach/Bowel: Severe sigmoid diverticulosis and scattered colonic diverticula without active inflammatory changes. There is no bowel obstruction or active inflammation. Appendectomy. Vascular/Lymphatic: Advanced aortoiliac atherosclerotic disease. There is a 2.2 cm infrarenal aortic ectasia. The IVC is unremarkable. No portal venous gas. There is no adenopathy. Reproductive: The uterus is grossly unremarkable. No adnexal masses. Other: Small fat containing right femoral hernia. No bowel herniation. Musculoskeletal: Osteopenia with degenerative changes of the spine and hips. Age indeterminate minimally displaced fractures of the posterior right eleventh and twelfth ribs. Mildly displaced fracture of the right L3 transverse process as well as fracture of tip of the right L1 transverse process. These fractures are new since the prior CT of 06/20/2021. IMPRESSION: 1. Fractures of the posterior right eleventh and twelfth ribs as well as right L1 and L3 transverse processes, new since the prior CT. No other acute intra-abdominal or pelvic pathology. 2. Severe sigmoid diverticulosis. No bowel obstruction. 3.  Aortic Atherosclerosis (ICD10-I70.0). Electronically Signed   By: AAnner CreteM.D.   On: 08/12/2022 03:59   DG Chest 1 View  Result Date: 08/12/2022 CLINICAL DATA:  Shortness of breath and fall EXAM: CHEST  1 VIEW COMPARISON:  11/05/2021 FINDINGS: Stable cardiomediastinal silhouette. Aortic atherosclerotic calcification. Chronic bronchitic change. Increased interstitial coarsening in the lower lungs compared to 11/05/2021. No pleural effusion or pneumothorax. No displaced rib  fractures. IMPRESSION: Question superimposed edema or atypical infection on a background of chronic lung disease. Electronically Signed   By: TPlacido SouM.D.   On: 08/12/2022 03:19    Scheduled Meds:  amLODipine  2.5 mg  Oral Daily   fentaNYL (SUBLIMAZE) injection  50 mcg Intravenous Once   fluticasone furoate-vilanterol  1 puff Inhalation Daily   And   umeclidinium bromide  1 puff Inhalation Daily   levothyroxine  125 mcg Oral QAC breakfast   lidocaine  1 patch Transdermal Q24H   melatonin  10 mg Oral QHS   pravastatin  40 mg Oral QHS   sodium chloride flush  3 mL Intravenous Q12H   Continuous Infusions:  Principal Problem:   Rib fractures Active Problems:   SDH (subdural hematoma) (HCC)   Falls, initial encounter   Lumbar compression fracture (HCC)   Dementia with behavioral disturbance (HCC)   History of CVA (cerebrovascular accident)   Essential hypertension   Hypothyroidism   Hyperlipidemia   Consultants: Neurosurgery  Procedures: None  Antibiotics: None    Time spent: 35 minutes    Erin Hearing ANP  [facility] 7 am - 330 pm/M-F for direct patient care and secure chat Please refer to Amion for contact info 1  days

## 2022-08-13 NOTE — Plan of Care (Signed)

## 2022-08-13 NOTE — Progress Notes (Signed)
OT Cancellation Note  Patient Details Name: Madeline Hernandez MRN: [id number] DOB: [birth year]   Cancelled Treatment:    Reason Eval/Treat Not Completed: OT screened, no needs identified, will sign off. Order received, chart reviewed. Per chart, pt from memory care unit, assist for ADLs baseline. Per PT, pt near baseline with 24/7 assistance. No skilled acute OT needs identified, will sign off.   Dessie Coma, M.S. OTR/L  08/13/22, 1:13 PM  ascom [phone]

## 2022-08-14 DIAGNOSIS — Z7189 Other specified counseling: Secondary | ICD-10-CM | POA: Diagnosis not present

## 2022-08-14 DIAGNOSIS — I10 Essential (primary) hypertension: Secondary | ICD-10-CM | POA: Diagnosis not present

## 2022-08-14 DIAGNOSIS — S065XAA Traumatic subdural hemorrhage with loss of consciousness status unknown, initial encounter: Secondary | ICD-10-CM | POA: Diagnosis not present

## 2022-08-14 DIAGNOSIS — F03918 Unspecified dementia, unspecified severity, with other behavioral disturbance: Secondary | ICD-10-CM | POA: Diagnosis not present

## 2022-08-14 DIAGNOSIS — S2241XA Multiple fractures of ribs, right side, initial encounter for closed fracture: Secondary | ICD-10-CM | POA: Diagnosis not present

## 2022-08-14 LAB — VITAMIN D 25 HYDROXY (VIT D DEFICIENCY, FRACTURES): Vit D, 25-Hydroxy: 29.99 ng/mL — ABNORMAL LOW (ref 30–100)

## 2022-08-14 MED ORDER — OXYCODONE HCL 5 MG PO TABS: 2.5000 mg | ORAL_TABLET | ORAL | 0 refills | Status: DC | PRN

## 2022-08-14 MED ORDER — ASPIRIN 81 MG PO CHEW: 81.0000 mg | CHEWABLE_TABLET | Freq: Every day | ORAL | Status: DC

## 2022-08-14 MED ORDER — ATIVAN 0.5 MG PO TABS: 0.5000 mg | ORAL_TABLET | Freq: Every day | ORAL | 0 refills | Status: DC | PRN

## 2022-08-14 MED ORDER — LIDOCAINE 5 % EX PTCH: 1.0000 | MEDICATED_PATCH | CUTANEOUS | 0 refills | Status: DC

## 2022-08-14 NOTE — Progress Notes (Signed)
[facility]) Hospital Liaison note:  Notified by Dr. Gevena Barre, MD of request for [place] services. Will continue to follow for disposition.  Please call with any outpatient palliative questions or concerns.  Thank you for the opportunity to participate in this patient's care.  Thank you, Lorelee Market, LPN [facility] Liaison [phone]

## 2022-08-14 NOTE — Plan of Care (Signed)

## 2022-08-14 NOTE — Discharge Summary (Signed)
Physician Discharge Summary   Patient: Madeline Hernandez MRN: [id number] DOB: [birth year]  Admit date:     08/12/2022  Discharge date: 08/14/22  Discharge Physician: Annita Brod   PCP: Bonnita Nasuti, MD   Recommendations at discharge:   Medication change: Daily aspirin on hold, to be resumed 12/30 New medication: Lidocaine patch applied topically to chest wall at site of rib fractures, on for 12 hours, off for 12 hours New medication: Oxy IR 2.5 mg every 4 hours as needed for moderate pain for the next 30 days Patient given referral for hospice to follow as outpatient Patient will have home health physical therapy and Occupational Therapy after returning to memory care unit Patient to return back to her assisted living memory care unit  Discharge Diagnoses: Principal Problem:   Rib fractures Active Problems:   Lumbar compression fracture (HCC)   SDH (subdural hematoma) ([place])   Fall   Dementia with behavioral disturbance ([place])   History of CVA (cerebrovascular accident)   Essential hypertension   Hypothyroidism   Hyperlipidemia  Resolved Problems:   * No resolved hospital problems. *  Hospital Course: 88 year old with past medical history of dementia who resides at memory care unit assisted living, COPD with chronic respiratory failure on 2 L nasal cannula and hypertension who presented to the emergency room on 12/26 after mechanical fall and found to have small subdural as well as posterior right 11th.  Neurosurgery consulted and recommended repeat imaging, but no additional intervention.  Assessment and Plan: Endocrine Hypothyroidism Assessment & Plan Continued on Synthroid  Nervous and Auditory Dementia with behavioral disturbance [facility]) Assessment & Plan Patient getting referral for hospice to follow  SDH (subdural hematoma) ([place]) Assessment & Plan Cleared by neurosurgery.  No additional intervention is then holding aspirin until 12/30.  Musculoskeletal and  Integument * Rib fractures Assessment & Plan Physical therapy at memory care unit.  As needed Oxy IR plus lidocaine patch.        Pain control - [employer] Controlled Substance Reporting System database was reviewed. and patient was instructed, not to drive, operate heavy machinery, perform activities at heights, swimming or participation in water activities or provide baby-sitting services while on Pain, Sleep and Anxiety Medications; until their outpatient Physician has advised to do so again. Also recommended to not to take more than prescribed Pain, Sleep and Anxiety Medications.  Consultants: Neurosurgery Procedures performed: None Disposition: Memory care assisted living unit Diet recommendation:  Discharge Diet Orders (From admission, onward)     Start     Ordered   08/14/22 0000  Diet - low sodium heart healthy        08/14/22 0935           Heart healthy DISCHARGE MEDICATION: Allergies as of 08/14/2022       Reactions   Penicillins         Medication List     TAKE these medications    albuterol 108 (90 Base) MCG/ACT inhaler Commonly known as: VENTOLIN HFA Inhale 2 puffs into the lungs every 6 (six) hours as needed for wheezing or shortness of breath.   amLODipine 2.5 MG tablet Commonly known as: NORVASC Take 2.5 mg by mouth daily.   aspirin 81 MG chewable tablet Chew 1 tablet (81 mg total) by mouth daily. Start taking on: August 16, 2022 What changed: These instructions start on August 16, 2022. If you are unsure what to do until then, ask your doctor or other care provider.  Ativan 0.5 MG tablet Generic drug: LORazepam Take 1 tablet (0.5 mg total) by mouth daily as needed.   barrier cream Crea Commonly known as: non-specified Apply 1 application. topically 3 (three) times daily.   FLUoxetine 10 MG capsule Commonly known as: PROZAC Take 10 mg by mouth daily.   levothyroxine 125 MCG tablet Commonly known as: SYNTHROID Take 125 mcg  by mouth daily before breakfast.   lidocaine 5 % Commonly known as: LIDODERM Place 1 patch onto the skin daily. Remove & Discard patch within 12 hours or as directed by MD   lovastatin 40 MG tablet Commonly known as: MEVACOR Take 20 mg by mouth every evening.   magnesium oxide 400 MG tablet Commonly known as: MAG-OX Take 600 mg by mouth daily.   Melatonin 10 MG Tabs Take 10 mg by mouth at bedtime.   oxyCODONE 5 MG immediate release tablet Commonly known as: Oxy IR/ROXICODONE Take 0.5 tablets (2.5 mg total) by mouth every 4 (four) hours as needed for moderate pain.   PreserVision AREDS 2 Caps Take 1 capsule by mouth 2 (two) times daily.   Trelegy Ellipta 100-62.5-25 MCG/ACT Aepb Generic drug: Fluticasone-Umeclidin-Vilant Inhale 1 puff into the lungs daily.        Discharge Exam: Filed Weights   08/12/22 0233  Weight: 50 kg   General: Oriented x 1 Cardiovascular: Regular rate and rhythm, S1-S2  Condition at discharge: fair  The results of significant diagnostics from this hospitalization (including imaging, microbiology, ancillary and laboratory) are listed below for reference.   Imaging Studies: CT HEAD WO CONTRAST (5MM)  Result Date: 08/12/2022 CLINICAL DATA:  Follow-up subdural hemorrhage EXAM: CT HEAD WITHOUT CONTRAST TECHNIQUE: Contiguous axial images were obtained from the base of the skull through the vertex without intravenous contrast. RADIATION DOSE REDUCTION: This exam was performed according to the departmental dose-optimization program which includes automated exposure control, adjustment of the mA and/or kV according to patient size and/or use of iterative reconstruction technique. COMPARISON:  08/12/2022 at 0341 hours FINDINGS: Brain: Trace amount of subdural hemorrhage overlying the right frontal convexity measuring up to 4 mm in maximal thickness, unchanged from prior. No associated mass effect or midline shift. No new sites of intracranial hemorrhage.  No large territory acute infarction. Patchy low-density changes within the periventricular and subcortical white matter compatible with chronic microvascular ischemic change. Mild-moderate diffuse cerebral volume loss. Vascular: Atherosclerotic calcifications involving the large vessels of the skull base. No unexpected hyperdense vessel. Skull: Negative for calvarial fracture. Sinuses/Orbits: No acute finding. Other: None. IMPRESSION: Trace amount of subdural hemorrhage overlying the right frontal convexity measuring up to 4 mm in maximal thickness, unchanged from prior. No associated mass effect or midline shift. Electronically Signed   By: Davina Poke D.O.   On: 08/12/2022 10:13   CT Chest W Contrast  Result Date: 08/12/2022 CLINICAL DATA:  88 year old female with history of trauma from a fall. EXAM: CT CHEST WITH CONTRAST TECHNIQUE: Multidetector CT imaging of the chest was performed during intravenous contrast administration. RADIATION DOSE REDUCTION: This exam was performed according to the departmental dose-optimization program which includes automated exposure control, adjustment of the mA and/or kV according to patient size and/or use of iterative reconstruction technique. CONTRAST:  1m OMNIPAQUE IOHEXOL 300 MG/ML  SOLN COMPARISON:  Chest CT 08/20/2020. FINDINGS: Cardiovascular: Heart size is normal. There is no significant pericardial fluid, thickening or pericardial calcification. There is aortic atherosclerosis, as well as atherosclerosis of the great vessels of the mediastinum and the coronary arteries,  including calcified atherosclerotic plaque in the left main, left anterior descending, left circumflex and right coronary arteries. Mediastinum/Nodes: Prominent borderline enlarged right hilar lymph node measuring 1.4 cm in short axis, slightly smaller than prior study from 06/20/2021, presumably benign. No other pathologically enlarged mediastinal or hilar lymph nodes are noted on today's  examination. Small hiatal hernia. No axillary lymphadenopathy. Lungs/Pleura: No acute consolidative airspace disease. No pleural effusions. Diffuse bronchial wall thickening with moderate to severe centrilobular and paraseptal emphysema. Upper Abdomen: Aortic atherosclerosis. Multiple tiny subcentimeter low-attenuation lesions in both kidneys, too small to definitively characterize, but statistically likely to represent tiny cysts. In addition, there is a 2.6 x 1.6 cm low-attenuation lesion in the interpolar region of the left kidney, compatible with a simple cyst (no imaging follow-up for any of these renal lesions is recommended). Musculoskeletal: No acute displaced fractures in the visualized portions of the skeleton. Compression fracture of superior endplate of L1 with 32% loss of anterior vertebral body height, new compared to the prior study, but nonacute. There are no aggressive appearing lytic or blastic lesions noted in the visualized portions of the skeleton. IMPRESSION: 1. No evidence of significant acute traumatic injury to the thorax. 2. New but nonacute compression fracture of superior endplate of L1 with 99% loss of anterior vertebral body height. 3. Diffuse bronchial wall thickening with moderate to severe centrilobular and paraseptal emphysema; imaging findings suggestive of underlying COPD. 4. Aortic atherosclerosis, in addition to left main and three-vessel coronary artery disease. 5. Small hiatal hernia. Aortic Atherosclerosis (ICD10-I70.0) and Emphysema (ICD10-J43.9). Electronically Signed   By: Vinnie Langton M.D.   On: 08/12/2022 05:35   CT Cervical Spine Wo Contrast  Result Date: 08/12/2022 CLINICAL DATA:  Fall. EXAM: CT CERVICAL SPINE WITHOUT CONTRAST TECHNIQUE: Multidetector CT imaging of the cervical spine was performed without intravenous contrast. Multiplanar CT image reconstructions were also generated. RADIATION DOSE REDUCTION: This exam was performed according to the  departmental dose-optimization program which includes automated exposure control, adjustment of the mA and/or kV according to patient size and/or use of iterative reconstruction technique. COMPARISON:  11/05/2021 FINDINGS: Alignment: No traumatic malalignment Skull base and vertebrae: No acute fracture. No primary bone lesion or focal pathologic process. Soft tissues and spinal canal: No prevertebral fluid or swelling. No visible canal hematoma. Disc levels:  Generalized degenerative endplate and facet spurring. Upper chest: No acute finding.  Prominent emphysema. IMPRESSION: Negative for cervical spine fracture or subluxation. Electronically Signed   By: Jorje Guild M.D.   On: 08/12/2022 04:11   CT Head Wo Contrast  Result Date: 08/12/2022 CLINICAL DATA:  Initial evaluation for acute trauma. EXAM: CT HEAD WITHOUT CONTRAST TECHNIQUE: Contiguous axial images were obtained from the base of the skull through the vertex without intravenous contrast. RADIATION DOSE REDUCTION: This exam was performed according to the departmental dose-optimization program which includes automated exposure control, adjustment of the mA and/or kV according to patient size and/or use of iterative reconstruction technique. COMPARISON:  Prior study from 11/05/2021. FINDINGS: Brain: Generalized age-related cerebral atrophy with chronic small vessel ischemic disease. There is a trace acute subdural hemorrhage overlying the anterior right frontal convexity. Hemorrhage measures up to 4 mm in maximal thickness without significant mass effect. No midline shift. No other acute intracranial hemorrhage. No acute large vessel territory infarct. No mass lesion or hydrocephalus. Vascular: No abnormal hyperdense vessel. Scattered vascular calcifications noted within the carotid siphons. Skull: Soft tissue contusion/laceration present at the right periorbital region. Calvarium intact. Sinuses/Orbits: Globes orbital soft tissues  demonstrate no acute  finding. Small amount of layering secretions noted within the left sphenoid sinus. Visualized paranasal sinuses are otherwise clear. No mastoid effusion. Other: None. IMPRESSION: 1. Trace acute subdural hemorrhage overlying the anterior right frontal convexity measuring up to 4 mm in maximal thickness without significant mass effect. 2. Soft tissue contusion/laceration at the right periorbital region. No calvarial fracture. 3. Underlying age-related cerebral atrophy with chronic small vessel ischemic disease. Critical Value/emergent results were called by telephone at the time of interpretation on 08/12/2022 at 3:57 am to provider JADE SUNG , who verbally acknowledged these results. Electronically Signed   By: Jeannine Boga M.D.   On: 08/12/2022 03:59   CT Abdomen Pelvis W Contrast  Result Date: 08/12/2022 CLINICAL DATA:  Abdominal pain. EXAM: CT ABDOMEN AND PELVIS WITH CONTRAST TECHNIQUE: Multidetector CT imaging of the abdomen and pelvis was performed using the standard protocol following bolus administration of intravenous contrast. RADIATION DOSE REDUCTION: This exam was performed according to the departmental dose-optimization program which includes automated exposure control, adjustment of the mA and/or kV according to patient size and/or use of iterative reconstruction technique. CONTRAST:  160m OMNIPAQUE IOHEXOL 300 MG/ML  SOLN COMPARISON:  CT abdomen pelvis dated 06/20/2021. FINDINGS: Lower chest: Emphysematous changes of the visualized lung bases. The visualized lung bases are otherwise clear. There is coronary vascular calcification. No intra-abdominal free air or free fluid. Hepatobiliary: The liver is unremarkable. No biliary dilatation. The gallbladder is unremarkable. Pancreas: Unremarkable. No pancreatic ductal dilatation or surrounding inflammatory changes. Spleen: Normal in size without focal abnormality. Adrenals/Urinary Tract: The adrenal glands are unremarkable. There is no  hydronephrosis on either side. There is symmetric enhancement of the renal parenchyma bilaterally. Small bilateral renal cysts measure up to 2.5 cm in the inferior pole of the right kidney. Additional subcentimeter hypodense lesions are too small to characterize. The visualized ureters appear unremarkable. Mild trabeculated appearance of the bladder wall, likely related to chronic bladder dysfunction. Stomach/Bowel: Severe sigmoid diverticulosis and scattered colonic diverticula without active inflammatory changes. There is no bowel obstruction or active inflammation. Appendectomy. Vascular/Lymphatic: Advanced aortoiliac atherosclerotic disease. There is a 2.2 cm infrarenal aortic ectasia. The IVC is unremarkable. No portal venous gas. There is no adenopathy. Reproductive: The uterus is grossly unremarkable. No adnexal masses. Other: Small fat containing right femoral hernia. No bowel herniation. Musculoskeletal: Osteopenia with degenerative changes of the spine and hips. Age indeterminate minimally displaced fractures of the posterior right eleventh and twelfth ribs. Mildly displaced fracture of the right L3 transverse process as well as fracture of tip of the right L1 transverse process. These fractures are new since the prior CT of 06/20/2021. IMPRESSION: 1. Fractures of the posterior right eleventh and twelfth ribs as well as right L1 and L3 transverse processes, new since the prior CT. No other acute intra-abdominal or pelvic pathology. 2. Severe sigmoid diverticulosis. No bowel obstruction. 3.  Aortic Atherosclerosis (ICD10-I70.0). Electronically Signed   By: AAnner CreteM.D.   On: 08/12/2022 03:59   DG Chest 1 View  Result Date: 08/12/2022 CLINICAL DATA:  Shortness of breath and fall EXAM: CHEST  1 VIEW COMPARISON:  11/05/2021 FINDINGS: Stable cardiomediastinal silhouette. Aortic atherosclerotic calcification. Chronic bronchitic change. Increased interstitial coarsening in the lower lungs compared  to 11/05/2021. No pleural effusion or pneumothorax. No displaced rib fractures. IMPRESSION: Question superimposed edema or atypical infection on a background of chronic lung disease. Electronically Signed   By: TPlacido SouM.D.   On: 08/12/2022 03:19    Microbiology:  Results for orders placed or performed during the hospital encounter of 08/12/22  Resp panel by RT-PCR (RSV, Flu A&B, Covid) Anterior Nasal Swab     Status: None   Collection Time: 08/12/22  3:29 AM   Specimen: Anterior Nasal Swab  Result Value Ref Range Status   SARS Coronavirus 2 by RT PCR NEGATIVE NEGATIVE Final    Comment: (NOTE) SARS-CoV-2 target nucleic acids are NOT DETECTED.  The SARS-CoV-2 RNA is generally detectable in upper respiratory specimens during the acute phase of infection. The lowest concentration of SARS-CoV-2 viral copies this assay can detect is 138 copies/mL. A negative result does not preclude SARS-Cov-2 infection and should not be used as the sole basis for treatment or other patient management decisions. A negative result may occur with  improper specimen collection/handling, submission of specimen other than nasopharyngeal swab, presence of viral mutation(s) within the areas targeted by this assay, and inadequate number of viral copies(<138 copies/mL). A negative result must be combined with clinical observations, patient history, and epidemiological information. The expected result is Negative.  Fact Sheet for Patients:  EntrepreneurPulse.com.au  Fact Sheet for Healthcare Providers:  IncredibleEmployment.be  This test is no t yet approved or cleared by the [place] FDA and  has been authorized for detection and/or diagnosis of SARS-CoV-2 by FDA under an Emergency Use Authorization (EUA). This EUA will remain  in effect (meaning this test can be used) for the duration of the COVID-19 declaration under Section 564(b)(1) of the Act, 21 U.S.C.section  360bbb-3(b)(1), unless the authorization is terminated  or revoked sooner.       Influenza A by PCR NEGATIVE NEGATIVE Final   Influenza B by PCR NEGATIVE NEGATIVE Final    Comment: (NOTE) The Xpert Xpress SARS-CoV-2/FLU/RSV plus assay is intended as an aid in the diagnosis of influenza from Nasopharyngeal swab specimens and should not be used as a sole basis for treatment. Nasal washings and aspirates are unacceptable for Xpert Xpress SARS-CoV-2/FLU/RSV testing.  Fact Sheet for Patients: EntrepreneurPulse.com.au  Fact Sheet for Healthcare Providers: IncredibleEmployment.be  This test is not yet approved or cleared by the [place] FDA and has been authorized for detection and/or diagnosis of SARS-CoV-2 by FDA under an Emergency Use Authorization (EUA). This EUA will remain in effect (meaning this test can be used) for the duration of the COVID-19 declaration under Section 564(b)(1) of the Act, 21 U.S.C. section 360bbb-3(b)(1), unless the authorization is terminated or revoked.     Resp Syncytial Virus by PCR NEGATIVE NEGATIVE Final    Comment: (NOTE) Fact Sheet for Patients: EntrepreneurPulse.com.au  Fact Sheet for Healthcare Providers: IncredibleEmployment.be  This test is not yet approved or cleared by the [place] FDA and has been authorized for detection and/or diagnosis of SARS-CoV-2 by FDA under an Emergency Use Authorization (EUA). This EUA will remain in effect (meaning this test can be used) for the duration of the COVID-19 declaration under Section 564(b)(1) of the Act, 21 U.S.C. section 360bbb-3(b)(1), unless the authorization is terminated or revoked.  Performed at [facility], [address] [place], [place] [address]     Labs: CBC: Recent Labs  Lab 08/12/22 0225 08/13/22 0337  WBC 12.6* 5.8  NEUTROABS 9.4*  --   HGB 13.7 11.7*  HCT 41.4 35.3*  MCV 90.8  90.7  PLT 230 694   Basic Metabolic Panel: Recent Labs  Lab 08/12/22 0225 08/13/22 0337  NA 133* 135  K 3.7 3.8  CL 98 100  CO2 24 27  GLUCOSE 143* 113*  BUN 15 17  CREATININE 0.64 0.67  CALCIUM 9.1 9.0   Liver Function Tests: Recent Labs  Lab 08/12/22 0225  AST 22  ALT 15  ALKPHOS 58  BILITOT 0.8  PROT 7.5  ALBUMIN 4.2   CBG: No results for input(s): "GLUCAP" in the last 168 hours.  Discharge time spent: less than 30 minutes.  Signed: Annita Brod, MD [facility] 08/14/2022

## 2022-08-14 NOTE — Hospital Course (Signed)
88 year old with past medical history of dementia who resides at memory care unit assisted living, COPD with chronic respiratory failure on 2 L nasal cannula and hypertension who presented to the emergency room on 12/26 after mechanical fall and found to have small subdural as well as posterior right 11th.  Neurosurgery consulted and recommended repeat imaging, but no additional intervention.

## 2022-08-14 NOTE — Assessment & Plan Note (Signed)
Continued on Synthroid

## 2022-08-14 NOTE — Assessment & Plan Note (Signed)
Physical therapy at memory care unit.  As needed Oxy IR plus lidocaine patch.

## 2022-08-14 NOTE — TOC Transition Note (Signed)
Transition of Care [facility]) - CM/SW Discharge Note   Patient Details  Name: Madeline Hernandez MRN: [id number] Date of Birth: [birth year]  [facility] CM/SW Contact:  Quin Hoop, LCSW Phone Number: 08/14/2022, 1:22 PM   Clinical Narrative:     Patient discharged back to [facility].  Transportation provided by [employer].  Patient informed of transport back to facility.  Final next level of care: Assisted Living Barriers to Discharge: No Barriers Identified   Patient Goals and CMS Choice      Discharge Placement                    Name of family member notified: Notified Museum/gallery conservator at [facility] Patient and family notified of of transfer: 08/14/22  Discharge Plan and Services Additional resources added to the After Visit Summary for                                       Social Determinants of Health (SDOH) Interventions SDOH Screenings   Food Insecurity: No Food Insecurity (08/12/2022)  Housing: Low Risk  (08/12/2022)  Transportation Needs: No Transportation Needs (08/12/2022)  Utilities: Not At Risk (08/12/2022)  Tobacco Use: Low Risk  (08/12/2022)     Readmission Risk Interventions     No data to display

## 2022-08-14 NOTE — Assessment & Plan Note (Signed)
Patient getting referral for hospice to follow

## 2022-08-14 NOTE — Consult Note (Signed)
Consultation Note Date: 08/14/2022   Patient Name: Madeline Hernandez  DOB: Aug 30, 1932  MRN: [id number]  Age / Sex: 88 y.o., female  PCP: Bonnita Nasuti, MD Referring Physician: Annita Brod, MD  Reason for Consultation: Establishing goals of care  HPI/Patient Profile: Madeline Hernandez is a 88 y.o. female with medical history significant of hypertension, hyperlipidemia, CVA, COPD, and dementia who presents to having a fall.  At baseline patient can ambulate with assistance with use of a walker but is usually in the bed due to her dementia.  Patient's son notes that 1:30 AM he was notified that the patient had fallen and possibly hit her head against the nightstand because of the bruise she had on her forehead. They think that she had possibly been getting up to use the restroom, but it is not totally clear.  The patient complains of pain on the right side of her back and she is trying to roll over in the hospital gurney.   Clinical Assessment and Goals of Care: Notes, diagnostics, and labs reviewed by me. In to speak with patient. She appears to be uncomfortable, but denies pain or other complaint. She states she is married and has "too many children", but unable to tell me how many she has. She states she is married. With any other questions she begins reciting various lines of the nursery rhyme Hey, Dittle, Dittle (the cat and the fiddle, and the dish ran away with the spoon). No family at bedside. Attempted to call son unsuccessfully.   Given her overall status, chronic health issues, and acute injuries, [place] conversation is needed to discuss care moving forward. Per notes, plans for hospice evaluation on discharge, and I agree with this evaluation.      SUMMARY OF RECOMMENDATIONS   Unable to reach son to discuss [place]. Plans in place for D/C to facility. Per notes, plans for outpatient evaluation for hospice  care in place. I agree with this evaluation.   Prognosis:  < 6 months      Primary Diagnoses: Present on Admission:  SDH (subdural hematoma) (HCC)  Rib fractures  Fall  Lumbar compression fracture (HCC)  Dementia with behavioral disturbance (HCC)  Essential hypertension  Hypothyroidism  Hyperlipidemia   I have reviewed the medical record, interviewed the patient and family, and examined the patient. The following aspects are pertinent.  Past Medical History:  Diagnosis Date   COPD (chronic obstructive pulmonary disease) ([place])    Dementia ([place])    Hyperlipidemia    Hypertension    Social History   Socioeconomic History   Marital status: Widowed    Spouse name: Not on file   Number of children: Not on file   Years of education: Not on file   Highest education level: Not on file  Occupational History   Not on file  Tobacco Use   Smoking status: Never   Smokeless tobacco: Never  Vaping Use   Vaping Use: Never used  Substance and Sexual Activity   Alcohol use: Not Currently  Drug use: Not Currently   Sexual activity: Not Currently  Other Topics Concern   Not on file  Social History Narrative   Not on file   Social Determinants of Health   Financial Resource Strain: Not on file  Food Insecurity: No Food Insecurity (08/12/2022)   Hunger Vital Sign    Worried About Running Out of Food in the Last Year: Never true    Ran Out of Food in the Last Year: Never true  Transportation Needs: No Transportation Needs (08/12/2022)   PRAPARE - Hydrologist (Medical): No    Lack of Transportation (Non-Medical): No  Physical Activity: Not on file  Stress: Not on file  Social Connections: Not on file   History reviewed. No pertinent family history. Scheduled Meds:  acetaminophen  650 mg Oral BID   amLODipine  2.5 mg Oral Daily   fentaNYL (SUBLIMAZE) injection  50 mcg Intravenous Once   FLUoxetine  10 mg Oral Daily   fluticasone  furoate-vilanterol  1 puff Inhalation Daily   And   umeclidinium bromide  1 puff Inhalation Daily   levothyroxine  125 mcg Oral QAC breakfast   lidocaine  1 patch Transdermal Q24H   melatonin  10 mg Oral QHS   pravastatin  40 mg Oral QHS   sodium chloride flush  3 mL Intravenous Q12H   Continuous Infusions: PRN Meds:.acetaminophen **OR** acetaminophen, LORazepam, mouth rinse, oxyCODONE Medications Prior to Admission:  Prior to Admission medications   Medication Sig Start Date End Date Taking? Authorizing Provider  amLODipine (NORVASC) 2.5 MG tablet Take 2.5 mg by mouth daily.   Yes [provider]  barrier cream (NON-SPECIFIED) CREA Apply 1 application. topically 3 (three) times daily.   Yes [provider]  FLUoxetine (PROZAC) 10 MG capsule Take 10 mg by mouth daily.   Yes [provider]  Fluticasone-Umeclidin-Vilant (TRELEGY ELLIPTA) 100-62.5-25 MCG/ACT AEPB Inhale 1 puff into the lungs daily.   Yes [provider]  levothyroxine (SYNTHROID) 125 MCG tablet Take 125 mcg by mouth daily before breakfast.   Yes [provider]  lovastatin (MEVACOR) 40 MG tablet Take 20 mg by mouth every evening.   Yes [provider]  Multiple Vitamins-Minerals (PRESERVISION AREDS 2) CAPS Take 1 capsule by mouth 2 (two) times daily.   Yes [provider]  albuterol (VENTOLIN HFA) 108 (90 Base) MCG/ACT inhaler Inhale 2 puffs into the lungs every 6 (six) hours as needed for wheezing or shortness of breath.    [provider]  aspirin 81 MG chewable tablet Chew 1 tablet (81 mg total) by mouth daily. 08/16/22   Annita Brod, MD  ATIVAN 0.5 MG tablet Take 1 tablet (0.5 mg total) by mouth daily as needed. 08/14/22   Annita Brod, MD  lidocaine (LIDODERM) 5 % Place 1 patch onto the skin daily. Remove & Discard patch within 12 hours or as directed by MD 08/14/22   Annita Brod, MD  magnesium oxide (MAG-OX) 400 MG tablet Take 600  mg by mouth daily.    [provider]  Melatonin 10 MG TABS Take 10 mg by mouth at bedtime.    [provider]  oxyCODONE (OXY IR/ROXICODONE) 5 MG immediate release tablet Take 0.5 tablets (2.5 mg total) by mouth every 4 (four) hours as needed for moderate pain. 08/14/22   Annita Brod, MD   Allergies  Allergen Reactions   Penicillins    Review of Systems  All  other systems reviewed and are negative.   Physical Exam Pulmonary:     Effort: Pulmonary effort is normal.  Neurological:     Mental Status: She is alert.     Vital Signs: BP (!) 146/82 (BP Location: Left Arm)   Pulse 67   Temp 98.1 F (36.7 C)   Resp 20   Ht '5\' 4"'$  (1.626 m)   Wt 50 kg   SpO2 94%   BMI 18.92 kg/m  Pain Scale: 0-10 POSS *See Group Information*: 1-Acceptable,Awake and alert Pain Score: 6    SpO2: SpO2: 94 % O2 Device:SpO2: 94 % O2 Flow Rate: .O2 Flow Rate (L/min): 1.5 L/min  IO: Intake/output summary:  Intake/Output Summary (Last 24 hours) at 08/14/2022 1135 Last data filed at 08/14/2022 4650 Gross per 24 hour  Intake --  Output 600 ml  Net -600 ml    LBM: Last BM Date : 08/12/22 Baseline Weight: Weight: 50 kg Most recent weight: Weight: 50 kg       Signed by: Asencion Gowda, NP   Please contact Palliative Medicine Team phone at [phone] for questions and concerns.  For individual provider: See Shea Evans

## 2022-08-14 NOTE — Progress Notes (Signed)
Pt refused to wear heart monitor.SRP, RN

## 2022-08-14 NOTE — Assessment & Plan Note (Signed)
Cleared by neurosurgery.  No additional intervention is then holding aspirin until 12/30.

## 2023-05-06 IMAGING — CT CT CERVICAL SPINE W/O CM
3 of 4 series · 11 of 34 positions shown, 13 images · non-contrast
Comparison: None.

CLINICAL DATA: Fall



[Series 5: sag bone · sagittal · 0.30mm/px · 5 of 65 slices shown, 6 images]
[im 22/65  bone]
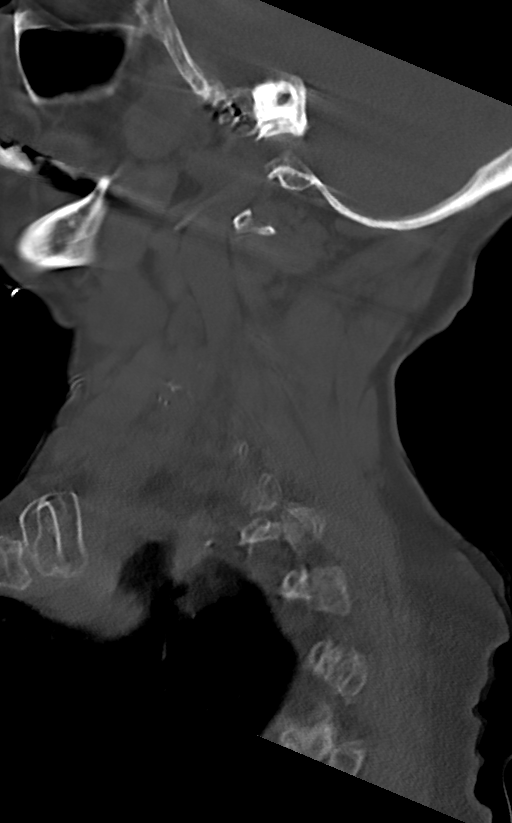
[im 27/65  bone]
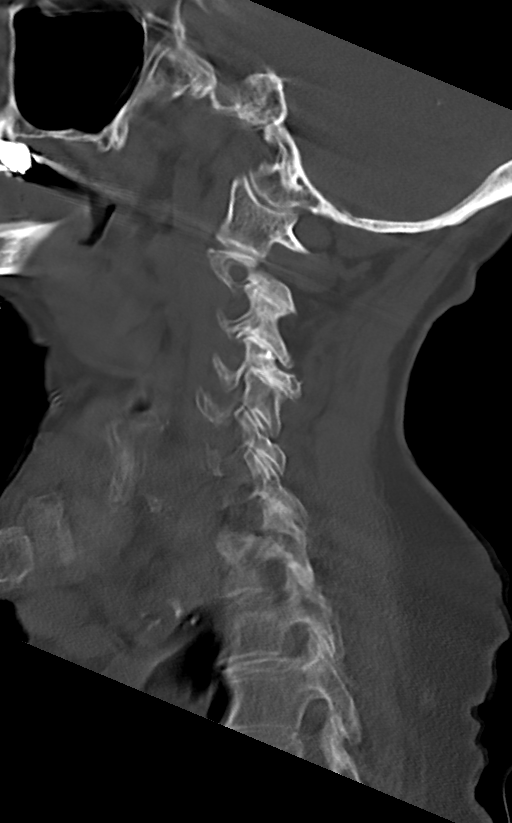
[im 33/65  soft-tissue]
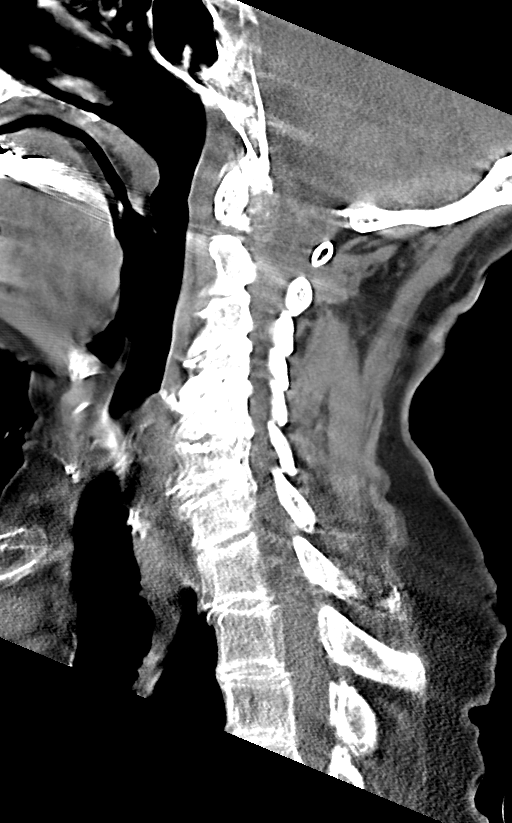
[im 33/65  bone]
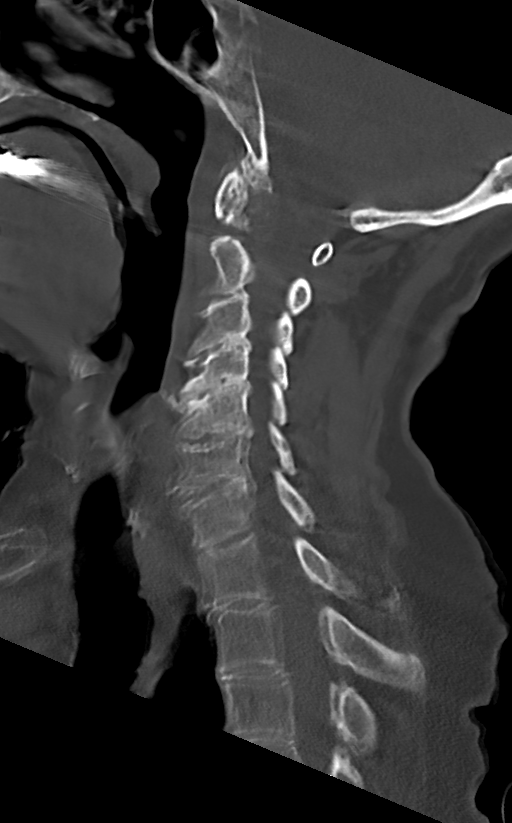
[im 38/65  bone]
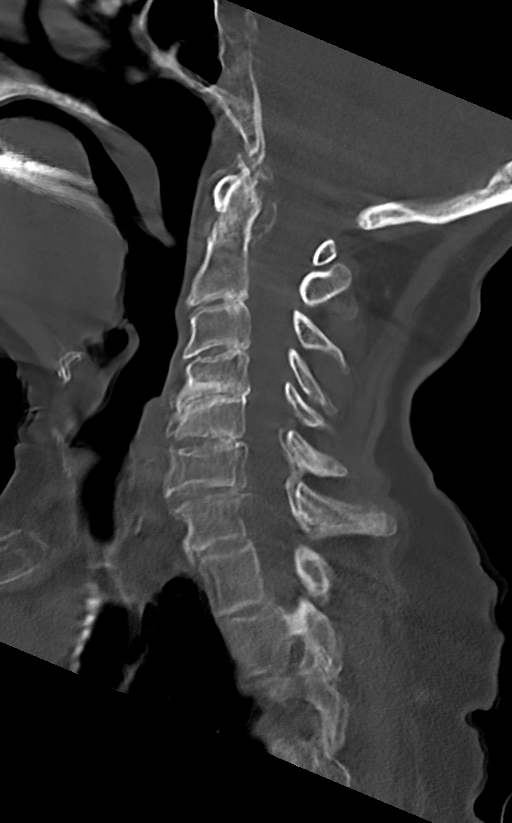
[im 43/65  bone]
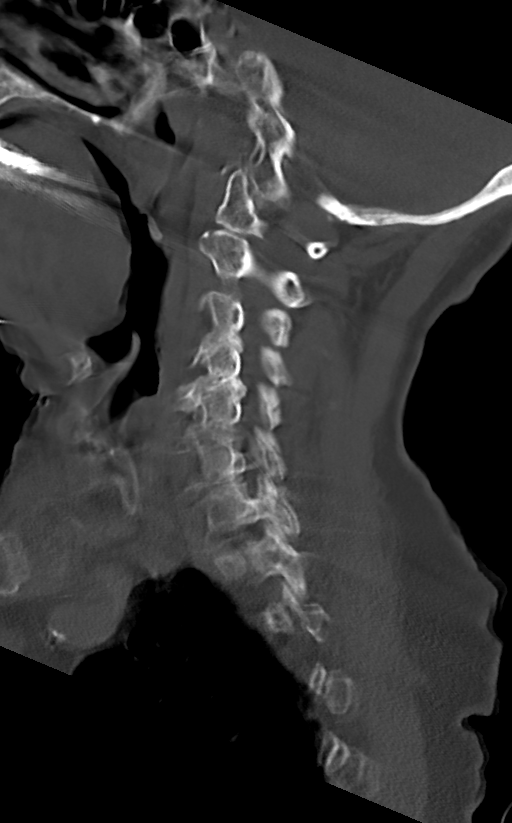

[Series 6: cor bone · coronal · 0.25mm/px · 3 of 64 slices shown]
[im 16/64  bone]
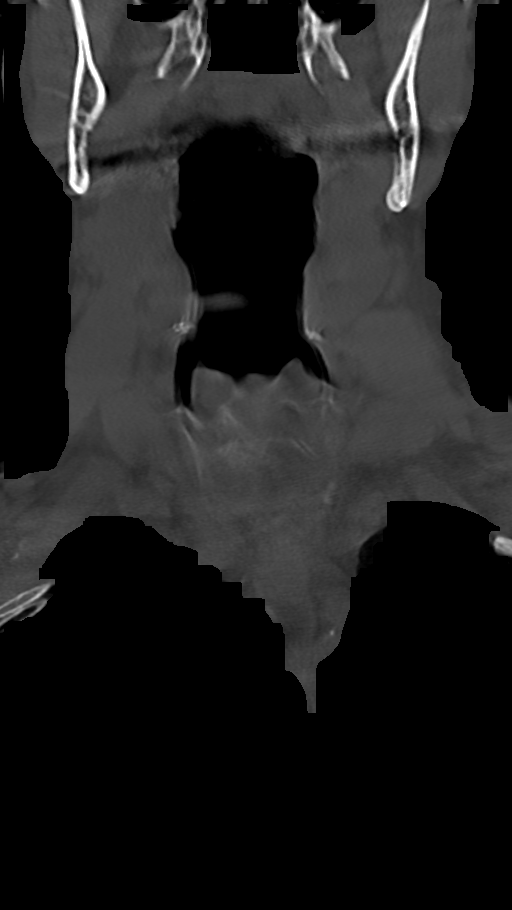
[im 27/64  bone]
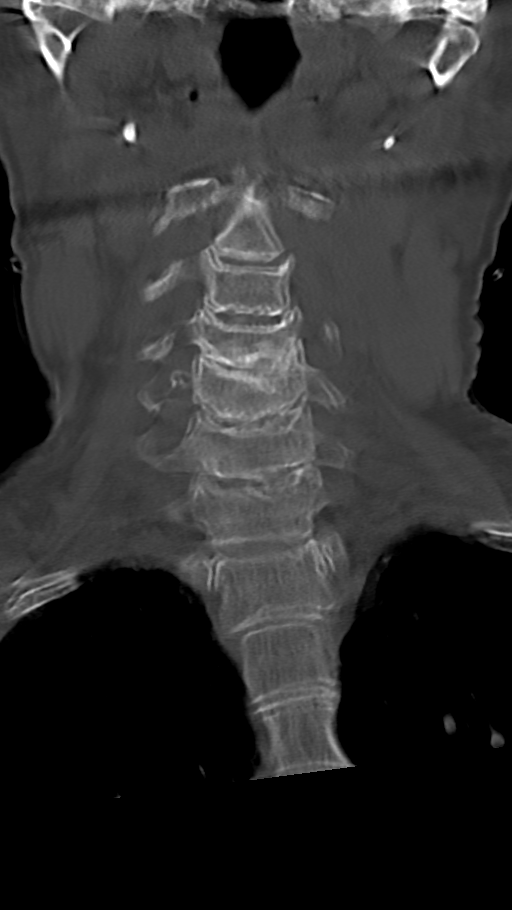
[im 38/64  bone]
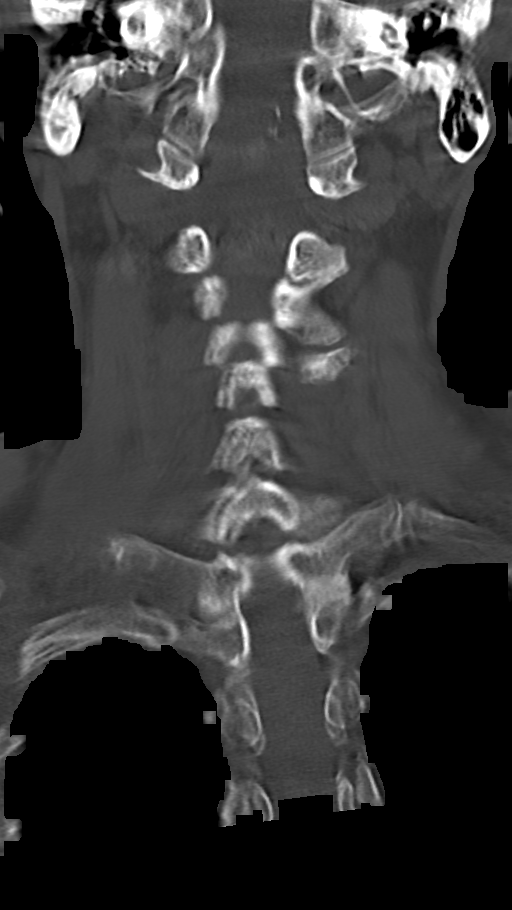

[Series 7: orthogonal axials · axial · 0.23mm/px · z∈[-313,-181]mm · 3 of 111 slices shown, 4 images]
[im 19/111  soft-tissue]
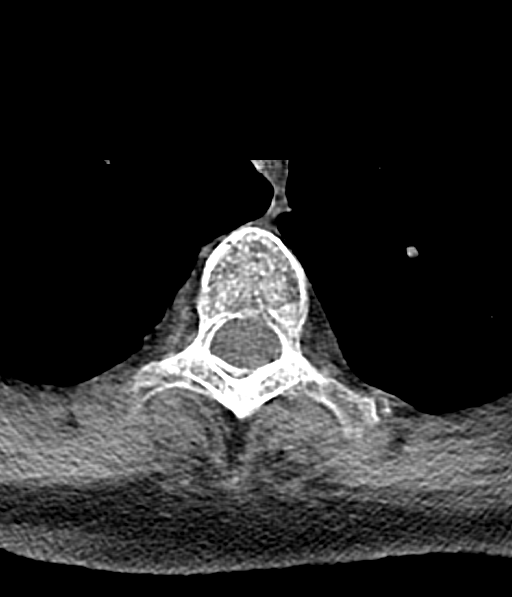
[im 19/111  bone]
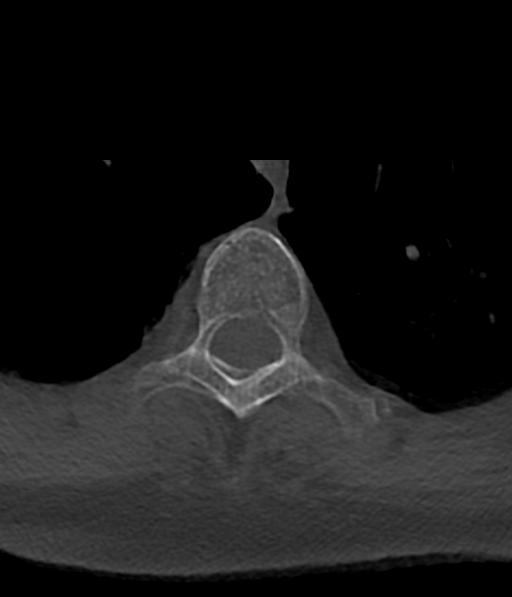
[im 56/111  bone]
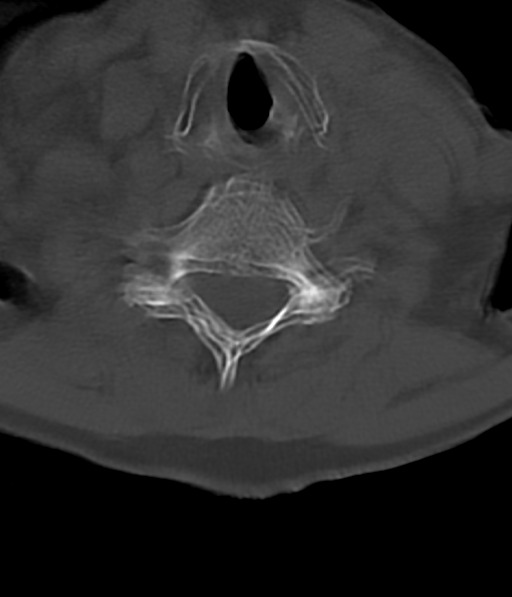
[im 92/111  bone]
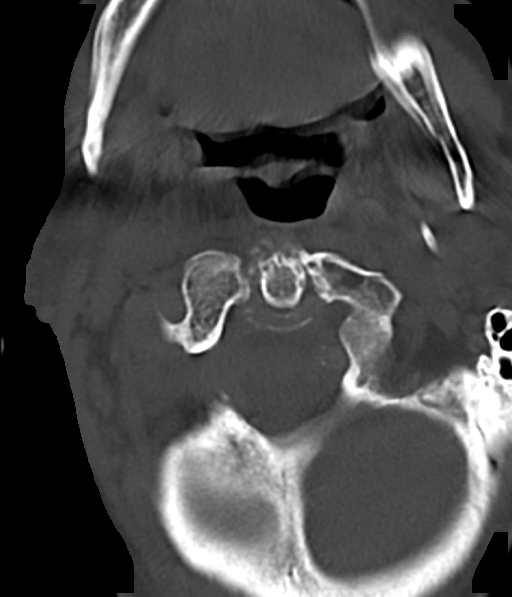

[11 of 34 positions shown; findings below may reference images not displayed]

FINDINGS: Image quality is degraded by motion artifact.  Within this confine:

Alignment: Normal. There is no antero or retrolisthesis. There is no
jumped or perched facets or other evidence of traumatic
malalignment.

Skull base and vertebrae: Skull base alignment is maintained.
Vertebral body heights are preserved. There is no definite evidence
of acute fracture.

Soft tissues and spinal canal: No prevertebral fluid or swelling. No
visible canal hematoma.

Disc levels: There is degenerative pannus about the dens resulting
in mild narrowing of the craniocervical junction. There is
multilevel intervertebral disc space narrowing with associated
degenerative endplate change, most advanced at C4-C5. Facet
arthropathy is most advanced at C2-C3 through C4-C5. There is no
definite high-grade spinal canal stenosis. There is moderate to
severe neural foraminal stenosis at C3-C4 through C5-C6.

Upper chest: There is severe emphysema in the lung apices.

Other: None.
IMPRESSION: 1. No definite evidence of acute fracture or traumatic malalignment
of the cervical spine, within the confines of motion degraded
images.
2. Multilevel degenerative changes, most advanced at C4-C5.
3. Emphysema.

## 2023-05-06 IMAGING — MR MR HEAD W/O CM
10 series · 48 of 48 positions shown · non-contrast
Comparison: Head CT performed earlier today 11/05/2021.

CLINICAL DATA: Mental status change, unknown cause. Additional
history provided: Unwitnessed fall, dementia,

EXAM:
MRI HEAD WITHOUT CONTRAST
TECHNIQUE: Multiplanar, multiecho pulse sequences of the brain and surrounding
structures were obtained without intravenous contrast.

[Series 2: ax dwi_tracew · axial · 3.0mm · 1.31mm/px · z∈[-68,+87]mm · 8 of 48 slices shown]
[im 1/48]
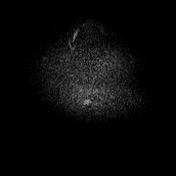
[im 7/48]
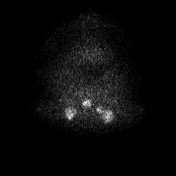
[im 14/48]
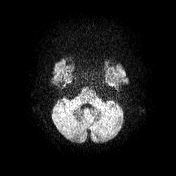
[im 21/48]
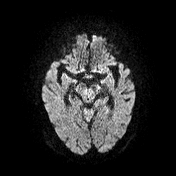
[im 27/48]
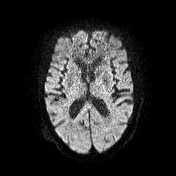
[im 34/48]
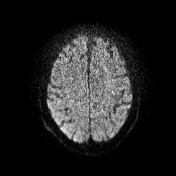
[im 41/48]
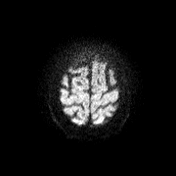
[im 48/48]
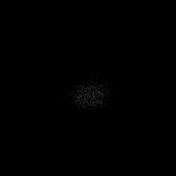

[Series 3: ax dwi_adc · axial · 3.0mm · 1.31mm/px · z∈[-68,+87]mm · 7 of 48 slices shown]
[im 1/48]
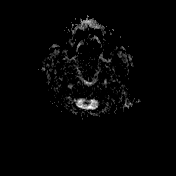
[im 8/48]
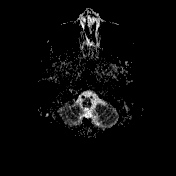
[im 16/48]
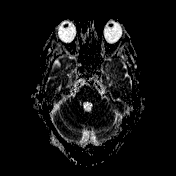
[im 24/48]
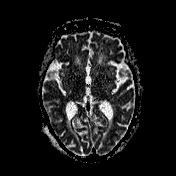
[im 32/48]
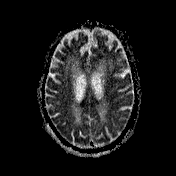
[im 40/48]
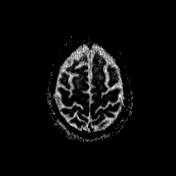
[im 48/48]
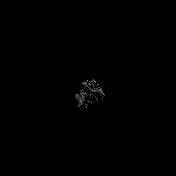

[Series 4: cor dwi_tracew · coronal · 5.0mm · 1.31mm/px · 5 of 38 slices shown]
[im 1/38]
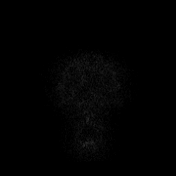
[im 10/38]
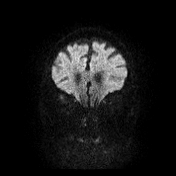
[im 19/38]
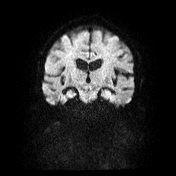
[im 28/38]
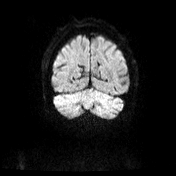
[im 38/38]
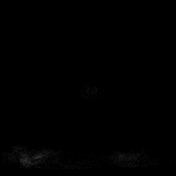

[Series 5: cor dwi_adc · coronal · 5.0mm · 1.31mm/px · 5 of 38 slices shown]
[im 1/38]
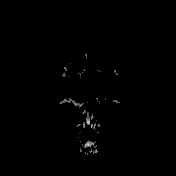
[im 10/38]
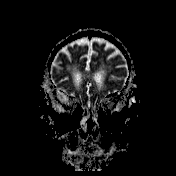
[im 19/38]
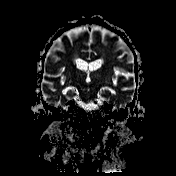
[im 28/38]
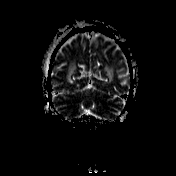
[im 38/38]
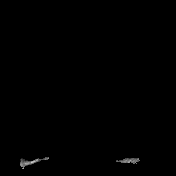

[Series 6: T2-star · axial · 5.0mm · 0.45mm/px · z∈[-68,+88]mm · 4 of 27 slices shown]
[im 1/27]
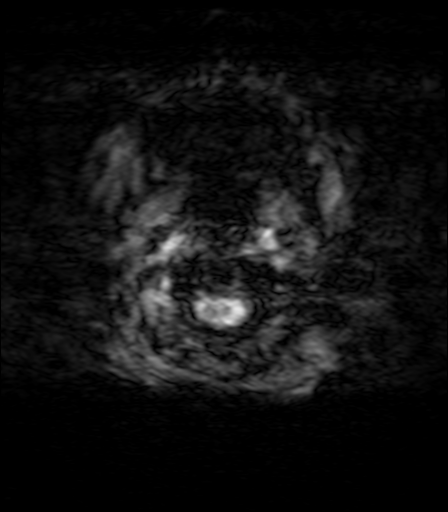
[im 9/27]
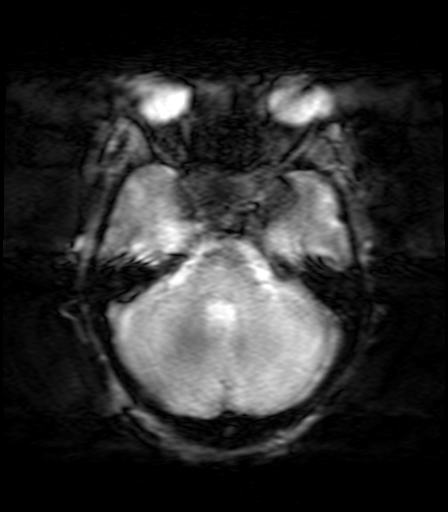
[im 18/27]
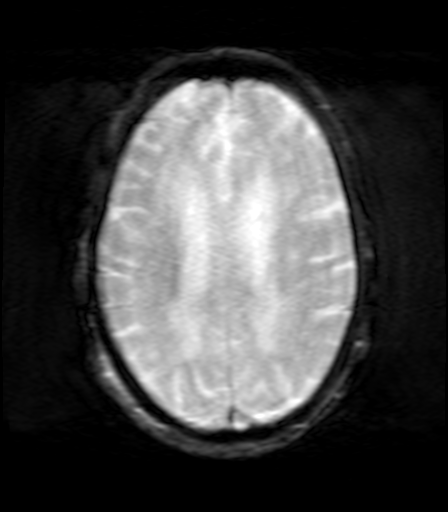
[im 27/27]
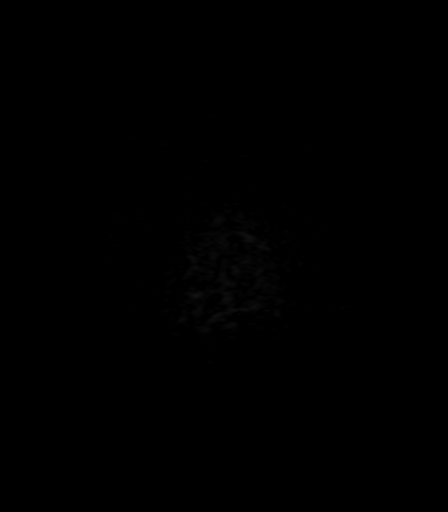

[Series 7: FLAIR · axial · 5.0mm · 1.20mm/px · z∈[-68,+88]mm · 4 of 27 slices shown]
[im 1/27]
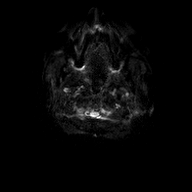
[im 9/27]
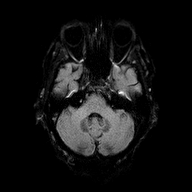
[im 18/27]
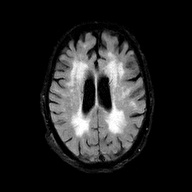
[im 27/27]
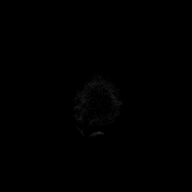

[Series 8: T2 · axial · 5.0mm · 0.45mm/px · z∈[-68,+88]mm · 4 of 27 slices shown (1 of 2)]
[im 1/27]
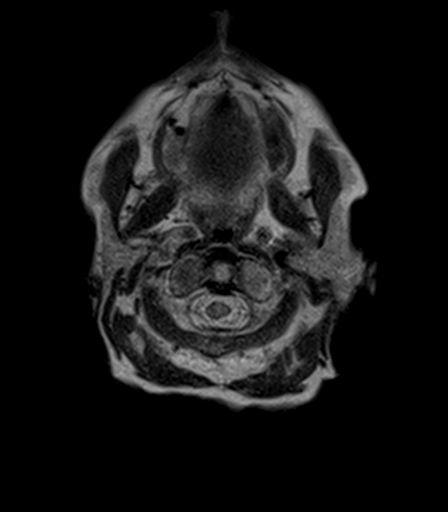
[im 9/27]
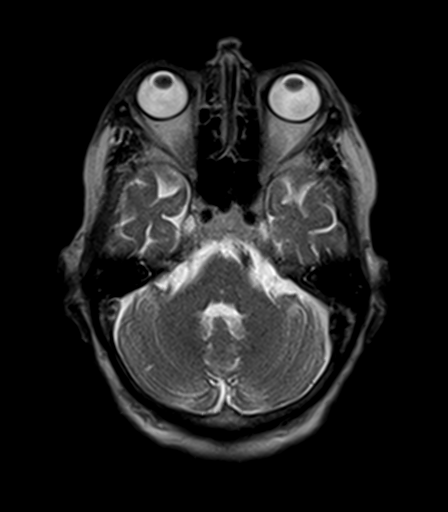
[im 18/27]
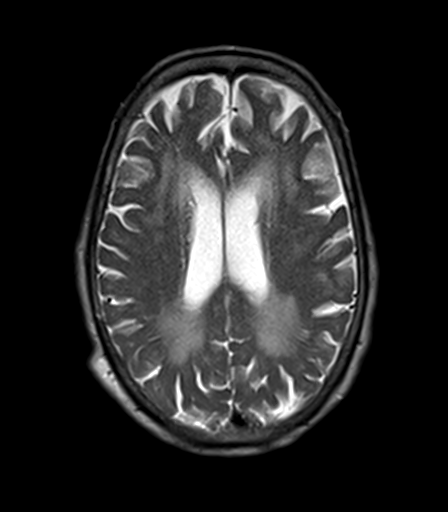
[im 27/27]
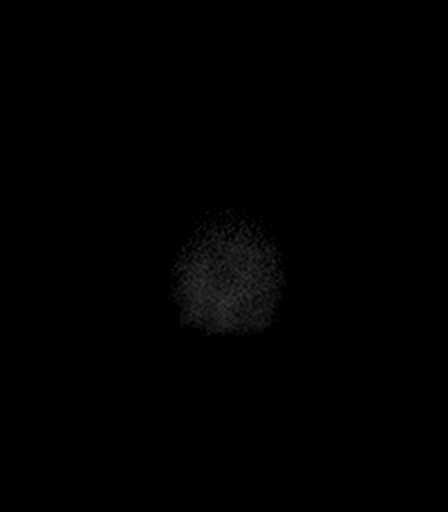

[Series 9: T1 · sagittal · 5.0mm · 0.94mm/px · 3 of 23 slices shown (1 of 2)]
[im 1/23]
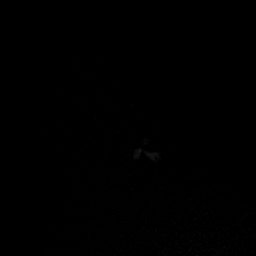
[im 12/23]
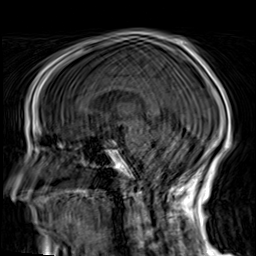
[im 23/23]
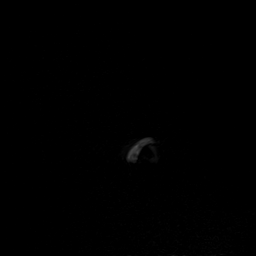

[Series 10: T1 · axial · 5.0mm · 0.90mm/px · z∈[-68,+88]mm · 4 of 27 slices shown (2 of 2)]
[im 1/27]
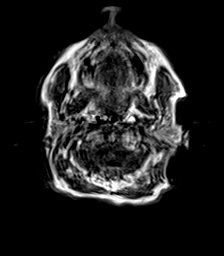
[im 9/27]
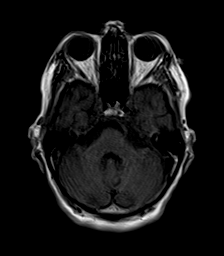
[im 18/27]
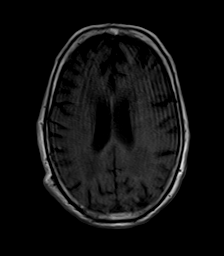
[im 27/27]
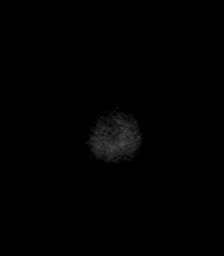

[Series 11: T2 · coronal · 5.0mm · 0.45mm/px · 4 of 31 slices shown (2 of 2)]
[im 1/31]
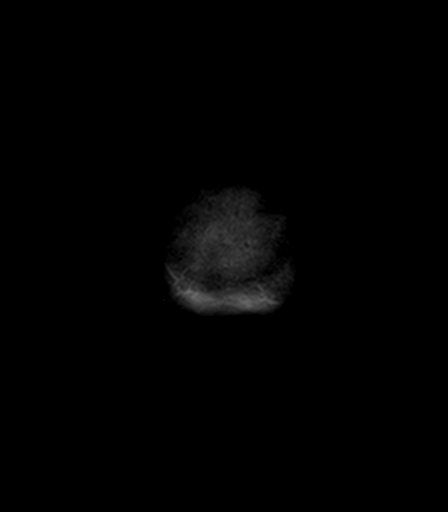
[im 11/31]
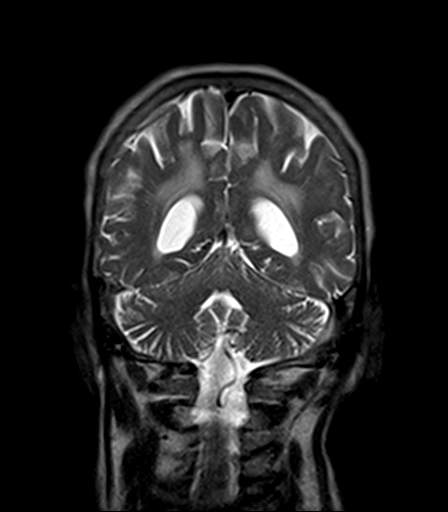
[im 21/31]
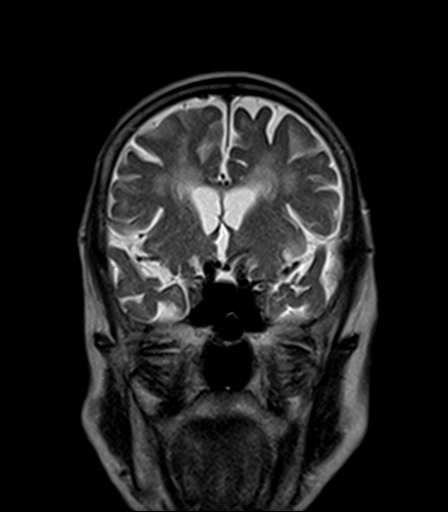
[im 31/31]
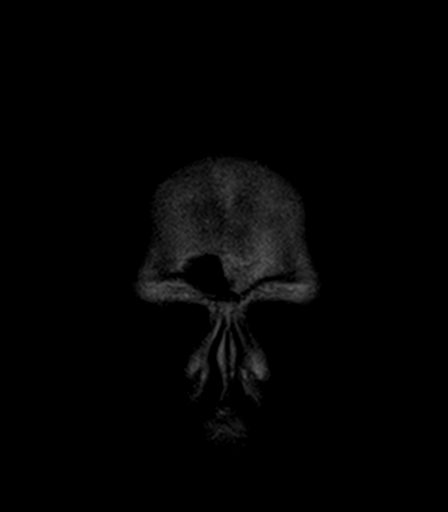

[48 of 48 positions shown; findings below may reference images not displayed]

FINDINGS: Intermittently motion degraded examination, limiting evaluation.
Most notably, there is severe motion degradation of the sagittal T1
weighted sequence and moderate motion degradation of the axial T1
weighted sequence.

Brain:

Cerebral atrophy with a slight medial temporal lobe predominance.

Advanced patchy and confluent T2 FLAIR hyperintense signal
abnormality within the cerebral white matter, nonspecific but
compatible chronic small vessel ischemic disease.

Subcentimeter chronic infarct within the right cerebellar hemisphere
(series 8, image 9).

There is no acute infarct.

No evidence of an intracranial mass.

No chronic intracranial blood products.

No extra-axial fluid collection.

No midline shift.

Vascular: Maintained flow voids within the proximal large arterial
vessels.

Skull and upper cervical spine: Within the limitations of motion
degradation, no focal suspicious marrow lesion is identified.

Sinuses/Orbits: Visualized orbits show no acute finding. No
significant paranasal sinus disease.

Other: Small-volume fluid within the right mastoid air cells.
IMPRESSION: Motion degraded examination, as described.

No evidence of acute intracranial abnormality.

Advanced chronic small vessel ischemic changes within the cerebral
white matter.

Subcentimeter chronic infarct within the right cerebellar
hemisphere.

Cerebral atrophy with a slight medial temporal lobe predominance.

Small-volume fluid within the right mastoid air cells.

## 2023-05-08 ENCOUNTER — Other Ambulatory Visit: Payer: Self-pay

## 2023-05-08 ENCOUNTER — Inpatient Hospital Stay
Admission: EM | Admit: 2023-05-08 | Discharge: 2023-05-11 | DRG: 378 | Disposition: A | Discharge status: Hospice | Source: Skilled Nursing Facility | Attending: Internal Medicine | Admitting: Internal Medicine

## 2023-05-08 DIAGNOSIS — R000 Tachycardia, unspecified: Secondary | ICD-10-CM | POA: Diagnosis present

## 2023-05-08 DIAGNOSIS — Z87820 Personal history of traumatic brain injury: Secondary | ICD-10-CM | POA: Diagnosis not present

## 2023-05-08 DIAGNOSIS — K921 Melena: Secondary | ICD-10-CM | POA: Diagnosis present

## 2023-05-08 DIAGNOSIS — Z8711 Personal history of peptic ulcer disease: Secondary | ICD-10-CM | POA: Diagnosis not present

## 2023-05-08 DIAGNOSIS — R627 Adult failure to thrive: Secondary | ICD-10-CM | POA: Diagnosis present

## 2023-05-08 DIAGNOSIS — R55 Syncope and collapse: Secondary | ICD-10-CM | POA: Diagnosis present

## 2023-05-08 DIAGNOSIS — I2510 Atherosclerotic heart disease of native coronary artery without angina pectoris: Secondary | ICD-10-CM | POA: Diagnosis present

## 2023-05-08 DIAGNOSIS — I10 Essential (primary) hypertension: Secondary | ICD-10-CM | POA: Diagnosis present

## 2023-05-08 DIAGNOSIS — Z515 Encounter for palliative care: Secondary | ICD-10-CM | POA: Diagnosis not present

## 2023-05-08 DIAGNOSIS — J449 Chronic obstructive pulmonary disease, unspecified: Secondary | ICD-10-CM | POA: Diagnosis present

## 2023-05-08 DIAGNOSIS — R2681 Unsteadiness on feet: Secondary | ICD-10-CM | POA: Diagnosis present

## 2023-05-08 DIAGNOSIS — I959 Hypotension, unspecified: Secondary | ICD-10-CM | POA: Diagnosis present

## 2023-05-08 DIAGNOSIS — R1912 Hyperactive bowel sounds: Secondary | ICD-10-CM | POA: Diagnosis present

## 2023-05-08 DIAGNOSIS — E039 Hypothyroidism, unspecified: Secondary | ICD-10-CM | POA: Diagnosis present

## 2023-05-08 DIAGNOSIS — Z79899 Other long term (current) drug therapy: Secondary | ICD-10-CM

## 2023-05-08 DIAGNOSIS — D62 Acute posthemorrhagic anemia: Secondary | ICD-10-CM | POA: Diagnosis present

## 2023-05-08 DIAGNOSIS — Z66 Do not resuscitate: Secondary | ICD-10-CM | POA: Diagnosis present

## 2023-05-08 DIAGNOSIS — R296 Repeated falls: Secondary | ICD-10-CM | POA: Diagnosis present

## 2023-05-08 DIAGNOSIS — E785 Hyperlipidemia, unspecified: Secondary | ICD-10-CM | POA: Diagnosis present

## 2023-05-08 DIAGNOSIS — K922 Gastrointestinal hemorrhage, unspecified: Secondary | ICD-10-CM | POA: Diagnosis present

## 2023-05-08 DIAGNOSIS — Z79890 Hormone replacement therapy (postmenopausal): Secondary | ICD-10-CM

## 2023-05-08 DIAGNOSIS — Z7982 Long term (current) use of aspirin: Secondary | ICD-10-CM | POA: Diagnosis not present

## 2023-05-08 DIAGNOSIS — Z9181 History of falling: Secondary | ICD-10-CM | POA: Diagnosis not present

## 2023-05-08 DIAGNOSIS — F0390 Unspecified dementia without behavioral disturbance: Secondary | ICD-10-CM | POA: Diagnosis present

## 2023-05-08 DIAGNOSIS — Z880 Allergy status to penicillin: Secondary | ICD-10-CM

## 2023-05-08 LAB — CBC WITH DIFFERENTIAL/PLATELET
Abs Immature Granulocytes: 0.09 10*3/uL — ABNORMAL HIGH (ref 0.00–0.07)
Basophils Absolute: 0 10*3/uL (ref 0.0–0.1)
Basophils Relative: 0 %
Eosinophils Absolute: 0 10*3/uL (ref 0.0–0.5)
Eosinophils Relative: 0 %
HCT: 24.5 % — ABNORMAL LOW (ref 36.0–46.0)
Hemoglobin: 8.1 g/dL — ABNORMAL LOW (ref 12.0–15.0)
Immature Granulocytes: 1 %
Lymphocytes Relative: 20 %
Lymphs Abs: 2.1 10*3/uL (ref 0.7–4.0)
MCH: 30.3 pg (ref 26.0–34.0)
MCHC: 33.1 g/dL (ref 30.0–36.0)
MCV: 91.8 fL (ref 80.0–100.0)
Monocytes Absolute: 0.6 10*3/uL (ref 0.1–1.0)
Monocytes Relative: 6 %
Neutro Abs: 7.8 10*3/uL — ABNORMAL HIGH (ref 1.7–7.7)
Neutrophils Relative %: 73 %
Platelets: 206 10*3/uL (ref 150–400)
RBC: 2.67 MIL/uL — ABNORMAL LOW (ref 3.87–5.11)
RDW: 13.5 % (ref 11.5–15.5)
WBC: 10.6 10*3/uL — ABNORMAL HIGH (ref 4.0–10.5)
nRBC: 0 % (ref 0.0–0.2)

## 2023-05-08 LAB — COMPREHENSIVE METABOLIC PANEL
ALT: 11 U/L (ref 0–44)
AST: 22 U/L (ref 15–41)
Albumin: 3.1 g/dL — ABNORMAL LOW (ref 3.5–5.0)
Alkaline Phosphatase: 40 U/L (ref 38–126)
Anion gap: 14 (ref 5–15)
BUN: 51 mg/dL — ABNORMAL HIGH (ref 8–23)
CO2: 18 mmol/L — ABNORMAL LOW (ref 22–32)
Calcium: 8.1 mg/dL — ABNORMAL LOW (ref 8.9–10.3)
Chloride: 103 mmol/L (ref 98–111)
Creatinine, Ser: 0.67 mg/dL (ref 0.44–1.00)
GFR, Estimated: >60 mL/min (ref >60)
Glucose, Bld: 114 mg/dL — ABNORMAL HIGH (ref 70–99)
Potassium: 4.8 mmol/L (ref 3.5–5.1)
Sodium: 135 mmol/L (ref 135–145)
Total Bilirubin: 1.2 mg/dL (ref 0.3–1.2)
Total Protein: 5.4 g/dL — ABNORMAL LOW (ref 6.5–8.1)

## 2023-05-08 LAB — IRON AND TIBC
Iron: 81 ug/dL (ref 28–170)
Saturation Ratios: 29 % (ref 10.4–31.8)
TIBC: 283 ug/dL (ref 250–450)
UIBC: 202 ug/dL

## 2023-05-08 LAB — RETICULOCYTES
Immature Retic Fract: 18.1 % — ABNORMAL HIGH (ref 2.3–15.9)
RBC.: 2.42 MIL/uL — ABNORMAL LOW (ref 3.87–5.11)
Retic Count, Absolute: 60.3 10*3/uL (ref 19.0–186.0)
Retic Ct Pct: 2.5 % (ref 0.4–3.1)

## 2023-05-08 LAB — HEMOGLOBIN AND HEMATOCRIT, BLOOD
HCT: 24 % — ABNORMAL LOW (ref 36.0–46.0)
HCT: 20.2 % — ABNORMAL LOW (ref 36.0–46.0)
Hemoglobin: 7.8 g/dL — ABNORMAL LOW (ref 12.0–15.0)
Hemoglobin: 6.7 g/dL — ABNORMAL LOW (ref 12.0–15.0)

## 2023-05-08 LAB — FERRITIN: Ferritin: 52 ng/mL (ref 11–307)

## 2023-05-08 LAB — TROPONIN I (HIGH SENSITIVITY): Troponin I (High Sensitivity): 3 ng/L (ref <18)

## 2023-05-08 LAB — LACTIC ACID, PLASMA: Lactic Acid, Venous: 1.5 mmol/L (ref 0.5–1.9)

## 2023-05-08 MED ORDER — ALBUTEROL SULFATE (2.5 MG/3ML) 0.083% IN NEBU: 3.0000 mL | INHALATION_SOLUTION | Freq: Four times a day (QID) | RESPIRATORY_TRACT | Status: DC | PRN

## 2023-05-08 MED ORDER — ACETAMINOPHEN 325 MG PO TABS: 650.0000 mg | ORAL_TABLET | Freq: Four times a day (QID) | ORAL | Status: DC | PRN

## 2023-05-08 MED ORDER — SODIUM CHLORIDE 0.9 % IV SOLN: INTRAVENOUS | Status: AC

## 2023-05-08 MED ORDER — OLANZAPINE 10 MG IM SOLR
INTRAMUSCULAR | Status: AC
  Filled 2023-05-08: qty 10

## 2023-05-08 MED ORDER — SODIUM CHLORIDE 0.9 % IV BOLUS
500.0000 mL | Freq: Once | INTRAVENOUS | Status: AC
  Administered 2023-05-08: 500 mL via INTRAVENOUS

## 2023-05-08 MED ORDER — PRAVASTATIN SODIUM 20 MG PO TABS
10.0000 mg | ORAL_TABLET | Freq: Every day | ORAL | Status: DC
  Administered 2023-05-08: 10 mg via ORAL
  Filled 2023-05-08: qty 1

## 2023-05-08 MED ORDER — ONDANSETRON HCL 4 MG/2ML IJ SOLN: 4.0000 mg | Freq: Four times a day (QID) | INTRAMUSCULAR | Status: DC | PRN

## 2023-05-08 MED ORDER — HYDRALAZINE HCL 20 MG/ML IJ SOLN
2.0000 mg | Freq: Four times a day (QID) | INTRAMUSCULAR | Status: DC | PRN
  Administered 2023-05-10: 2 mg via INTRAVENOUS
  Filled 2023-05-08: qty 1

## 2023-05-08 MED ORDER — MAGNESIUM OXIDE 400 MG PO TABS
400.0000 mg | ORAL_TABLET | Freq: Every day | ORAL | Status: DC
  Filled 2023-05-08 (×2): qty 1

## 2023-05-08 MED ORDER — FLUOXETINE HCL 20 MG PO CAPS
30.0000 mg | ORAL_CAPSULE | Freq: Every day | ORAL | Status: DC
  Filled 2023-05-08 (×2): qty 1

## 2023-05-08 MED ORDER — ASPIRIN 81 MG PO CHEW: 81.0000 mg | CHEWABLE_TABLET | Freq: Every day | ORAL | Status: DC

## 2023-05-08 MED ORDER — OLANZAPINE 10 MG IM SOLR
2.5000 mg | Freq: Four times a day (QID) | INTRAMUSCULAR | Status: DC | PRN
  Administered 2023-05-08 – 2023-05-09 (×3): 2.5 mg via INTRAMUSCULAR
  Filled 2023-05-08 (×3): qty 10

## 2023-05-08 MED ORDER — PANTOPRAZOLE INFUSION (NEW) - SIMPLE MED
8.0000 mg/h | INTRAVENOUS | Status: DC
  Administered 2023-05-08 – 2023-05-10 (×4): 8 mg/h via INTRAVENOUS
  Filled 2023-05-08 (×4): qty 100

## 2023-05-08 MED ORDER — FENTANYL CITRATE PF 50 MCG/ML IJ SOSY
50.0000 ug | PREFILLED_SYRINGE | Freq: Once | INTRAMUSCULAR | Status: AC
  Administered 2023-05-08: 50 ug via INTRAVENOUS
  Filled 2023-05-08: qty 1

## 2023-05-08 MED ORDER — PANTOPRAZOLE 80MG IVPB - SIMPLE MED
80.0000 mg | Freq: Once | INTRAVENOUS | Status: AC
  Administered 2023-05-08: 80 mg via INTRAVENOUS
  Filled 2023-05-08: qty 100

## 2023-05-08 MED ORDER — LEVOTHYROXINE SODIUM 25 MCG PO TABS
125.0000 ug | ORAL_TABLET | Freq: Every day | ORAL | Status: DC
  Administered 2023-05-09: 125 ug via ORAL
  Filled 2023-05-08: qty 1

## 2023-05-08 MED ORDER — MELATONIN 5 MG PO TABS
5.0000 mg | ORAL_TABLET | Freq: Every day | ORAL | Status: DC
  Administered 2023-05-08: 5 mg via ORAL
  Filled 2023-05-08: qty 1

## 2023-05-08 MED ORDER — HYDROMORPHONE HCL 1 MG/ML IJ SOLN
0.5000 mg | INTRAMUSCULAR | Status: DC | PRN
  Administered 2023-05-08 – 2023-05-10 (×3): 0.5 mg via INTRAVENOUS
  Filled 2023-05-08 (×3): qty 0.5

## 2023-05-08 MED ORDER — ACETAMINOPHEN 650 MG RE SUPP: 650.0000 mg | Freq: Four times a day (QID) | RECTAL | Status: DC | PRN

## 2023-05-08 MED ORDER — FLUTICASONE FUROATE-VILANTEROL 100-25 MCG/ACT IN AEPB
1.0000 | INHALATION_SPRAY | Freq: Every day | RESPIRATORY_TRACT | Status: DC
  Administered 2023-05-09: 1 via RESPIRATORY_TRACT
  Filled 2023-05-08 (×2): qty 28

## 2023-05-08 MED ORDER — PANTOPRAZOLE SODIUM 40 MG IV SOLR: 40.0000 mg | Freq: Two times a day (BID) | INTRAVENOUS | Status: DC

## 2023-05-08 MED ORDER — UMECLIDINIUM BROMIDE 62.5 MCG/ACT IN AEPB
1.0000 | INHALATION_SPRAY | Freq: Every day | RESPIRATORY_TRACT | Status: DC
  Administered 2023-05-09: 1 via RESPIRATORY_TRACT
  Filled 2023-05-08 (×2): qty 7

## 2023-05-08 MED ORDER — FLUOXETINE HCL 10 MG PO CAPS: 10.0000 mg | ORAL_CAPSULE | Freq: Every day | ORAL | Status: DC

## 2023-05-08 NOTE — ED Notes (Signed)
Pt covered in dark tarry stool. Pt cleaned. New linens and brief in place.

## 2023-05-08 NOTE — H&P (Signed)
History and Physical    Madeline Hernandez ZOX:[id number] DOB: 15-Jul-1933 DOA: 05/08/2023  PCP: System, Provider Not In (Confirm with patient/family/NH records and if not entered, this has to be entered at [facility] point of entry) Patient coming from: SNF memory unit  I have personally briefly reviewed patient's old medical records in [facility]  Chief Complaint: Syncope, black stool  HPI: Madeline Hernandez is a 89 y.o. female with medical history significant of remote history of GI bleed secondary to peptic ulcer, COPD, dementia, HTN, frequent falls, sent from nursing home for evaluation of syncope and black tarry stool.  Patient has dementia and unable to provide any history, all history provided by son at bedside.  At baseline patient resides at a memory unit of nursing home.  This morning, patient pushed breakfast away and appeared to have severe abdominal pain.  Later it was found the patient was unconsciousness and lying the bed with dark-colored feces on bed sheet.  ED Course: Tachycardia nonhypotensive, blood work showed hemoglobin 8.1 compared to baseline 11.7  Patient was given IV bolus and Zofran in the ED. ED patient discussed case with patient family at bedside who expressed patient does not wish any invasive measures including endoscopy.  Review of Systems: As per HPI otherwise 14 point review of systems negative.    Past Medical History:  Diagnosis Date   COPD (chronic obstructive pulmonary disease) (HCC)    Dementia (HCC)    Hyperlipidemia    Hypertension     No past surgical history on file.   reports that she has never smoked. She has never used smokeless tobacco. She reports that she does not currently use alcohol. She reports that she does not currently use drugs.  Allergies  Allergen Reactions   Penicillins     No family history on file.   Prior to Admission medications   Medication Sig Start Date End Date Taking? Authorizing Provider  albuterol (VENTOLIN  HFA) 108 (90 Base) MCG/ACT inhaler Inhale 2 puffs into the lungs every 6 (six) hours as needed for wheezing or shortness of breath.    [provider]  amLODipine (NORVASC) 2.5 MG tablet Take 2.5 mg by mouth daily.    [provider]  aspirin 81 MG chewable tablet Chew 1 tablet (81 mg total) by mouth daily. 08/16/22   Hollice Espy, MD  ATIVAN 0.5 MG tablet Take 1 tablet (0.5 mg total) by mouth daily as needed. 08/14/22   Hollice Espy, MD  barrier cream (NON-SPECIFIED) CREA Apply 1 application. topically 3 (three) times daily.    [provider]  FLUoxetine (PROZAC) 10 MG capsule Take 10 mg by mouth daily.    [provider]  Fluticasone-Umeclidin-Vilant (TRELEGY ELLIPTA) 100-62.5-25 MCG/ACT AEPB Inhale 1 puff into the lungs daily.    [provider]  levothyroxine (SYNTHROID) 125 MCG tablet Take 125 mcg by mouth daily before breakfast.    [provider]  lidocaine (LIDODERM) 5 % Place 1 patch onto the skin daily. Remove & Discard patch within 12 hours or as directed by MD 08/14/22   Hollice Espy, MD  lovastatin (MEVACOR) 40 MG tablet Take 20 mg by mouth every evening.    [provider]  magnesium oxide (MAG-OX) 400 MG tablet Take 600 mg by mouth daily.    [provider]  Melatonin 10 MG TABS Take 10 mg by mouth at bedtime.    [provider]  Multiple Vitamins-Minerals (PRESERVISION AREDS 2) CAPS Take 1  capsule by mouth 2 (two) times daily.    [provider]  oxyCODONE (OXY IR/ROXICODONE) 5 MG immediate release tablet Take 0.5 tablets (2.5 mg total) by mouth every 4 (four) hours as needed for moderate pain. 08/14/22   Hollice Espy, MD    Physical Exam: Vitals:   05/08/23 0930 05/08/23 0956 05/08/23 1000  BP: (!) 128/93  133/73  Pulse: (!) 110  94  Resp: (!) 28  (!) 27  Temp:  97.9 F (36.6 C)   TempSrc:  Axillary   SpO2: 98%  100%    Constitutional: NAD, calm,  comfortable Vitals:   05/08/23 0930 05/08/23 0956 05/08/23 1000  BP: (!) 128/93  133/73  Pulse: (!) 110  94  Resp: (!) 28  (!) 27  Temp:  97.9 F (36.6 C)   TempSrc:  Axillary   SpO2: 98%  100%   Eyes: PERRL, lids and conjunctivae normal ENMT: Mucous membranes are moist. Posterior pharynx clear of any exudate or lesions.Normal dentition.  Neck: normal, supple, no masses, no thyromegaly Respiratory: clear to auscultation bilaterally, no wheezing, no crackles. Normal respiratory effort. No accessory muscle use.  Cardiovascular: Regular rate and rhythm, no murmurs / rubs / gallops. No extremity edema. 2+ pedal pulses. No carotid bruits.  Abdomen: some tenderness on epigastric area, no rebound no guarding, no masses palpated. No hepatosplenomegaly. Bowel sounds positive.  Musculoskeletal: no clubbing / cyanosis. No joint deformity upper and lower extremities. Good ROM, no contractures. Normal muscle tone.  Skin: no rashes, lesions, ulcers. No induration Neurologic: CN 2-12 grossly intact. Sensation intact, DTR normal. Strength 5/5 in all 4.  Psychiatric: Normal judgment and insight. Alert and oriented x 3. Normal mood.     Labs on Admission: I have personally reviewed following labs and imaging studies  CBC: Recent Labs  Lab 05/08/23 0930  WBC 10.6*  NEUTROABS 7.8*  HGB 8.1*  HCT 24.5*  MCV 91.8  PLT 206   Basic Metabolic Panel: Recent Labs  Lab 05/08/23 0930  NA 135  K 4.8  CL 103  CO2 18*  GLUCOSE 114*  BUN 51*  CREATININE 0.67  CALCIUM 8.1*   GFR: CrCl cannot be calculated (Unknown ideal weight.). Liver Function Tests: Recent Labs  Lab 05/08/23 0930  AST 22  ALT 11  ALKPHOS 40  BILITOT 1.2  PROT 5.4*  ALBUMIN 3.1*   No results for input(s): "LIPASE", "AMYLASE" in the last 168 hours. No results for input(s): "AMMONIA" in the last 168 hours. Coagulation Profile: No results for input(s): "INR", "PROTIME" in the last 168 hours. Cardiac Enzymes: No  results for input(s): "CKTOTAL", "CKMB", "CKMBINDEX", "TROPONINI" in the last 168 hours. BNP (last 3 results) No results for input(s): "PROBNP" in the last 8760 hours. HbA1C: No results for input(s): "HGBA1C" in the last 72 hours. CBG: No results for input(s): "GLUCAP" in the last 168 hours. Lipid Profile: No results for input(s): "CHOL", "HDL", "LDLCALC", "TRIG", "CHOLHDL", "LDLDIRECT" in the last 72 hours. Thyroid Function Tests: No results for input(s): "TSH", "T4TOTAL", "FREET4", "T3FREE", "THYROIDAB" in the last 72 hours. Anemia Panel: No results for input(s): "VITAMINB12", "FOLATE", "FERRITIN", "TIBC", "IRON", "RETICCTPCT" in the last 72 hours. Urine analysis:    Component Value Date/Time   COLORURINE YELLOW (A) 08/12/2022 0442   APPEARANCEUR CLEAR (A) 08/12/2022 0442   LABSPEC 1.024 08/12/2022 0442   PHURINE 7.0 08/12/2022 0442   GLUCOSEU NEGATIVE 08/12/2022 0442   HGBUR NEGATIVE 08/12/2022 0442   BILIRUBINUR NEGATIVE 08/12/2022 0442   Lavenia Atlas  NEGATIVE 08/12/2022 0442   PROTEINUR NEGATIVE 08/12/2022 0442   NITRITE NEGATIVE 08/12/2022 0442   LEUKOCYTESUR NEGATIVE 08/12/2022 0442    Radiological Exams on Admission: No results found.  EKG: Independently reviewed.  Sinus tachycardia, no acute ST changes.  Assessment/Plan Principal Problem:   GI bleed Active Problems:   Melena   Acute blood loss anemia  (please populate well all problems here in Problem List. (For example, if patient is on BP meds at home and you resume or decide to hold them, it is a problem that needs to be her. Same for CAD, COPD, HLD and so on)  Acute blood loss anemia Melena -Clinically suspect recurrent peptic ulcer bleeding as patient family reported the patient had a history of such peptic ulcer bleeding required endoscopy and transfusion about 20 years ago. BUN/Cre ratio>30 -Confirmed with family that patient does not wish any invasive procedure including endoscopy or colonoscopy. -Discussed  with family regarding plan will include repeating H&H this afternoon at 1 PM, if there is a significant drop of H&H or patient develop further tachycardia over 12 BP, will order CTA and possible IR intervention.  Family agreed. -Type and screen ordered, family also agreed with transfusion if necessary -Start PPI bolus and PPI drip -N.p.o. and IV fluid -Hold off home BP meds, start as needed hydralazine -Overall prognosis is poor, family made aware, and confirmed that patient wishes DNR.  Syncope -Likely vasovagal secondary to GI bleed, management as above.  HTN -As above  Frequent falls -Baseline has unsteady gait and with recent frequent falls patient was put on hospice.  Confirmed with family that patient does not have a diagnose of terminal illness.  Advanced dementia -Mentation at baseline  DVT prophylaxis: SCD Code Status: DNR Family Communication: Son at bedside Disposition Plan: Patient is sick with GI bleed and acute anemia requiring inpatient management expect more than 2 midnight hospital stay Consults called: None Admission status: PCU   Madeline General MD [facility] Pager [phone]  05/08/2023, 11:22 AM

## 2023-05-08 NOTE — Plan of Care (Signed)

## 2023-05-08 NOTE — Progress Notes (Signed)
Hb trending 8.1>7.8, and vital signs wise, patient not tachy and BP stable, will hold off CTA. Next Hb reading 22:00

## 2023-05-08 NOTE — ED Notes (Signed)
Lab called for type and screen

## 2023-05-08 NOTE — ED Notes (Signed)
MD messaged for PRN order for agitation

## 2023-05-08 NOTE — ED Provider Notes (Signed)
[facility] Provider Note    Event Date/Time   First MD Initiated Contact with Patient 05/08/23 [phone]     (approximate)   History   Loss of Consciousness and Melena (/)   HPI  Madeline Hernandez is a 89 y.o. female with a history of dementia, hypertension, hyperlipidemia, and hypothyroidism who presents from the memory care unit with a syncopal episode.  Per EMS, the patient was being led to the bathroom by staff when she became weak and passed out.  She was let down to the floor and did not fall or injure herself.  She had a bowel movement which was described as dark and tarry.  The patient herself appears uncomfortable, is moaning, and when asked what hurts she states "everything."  She is unable to give much other specific history.  I reviewed the past medical records.  The patient was admitted in late December after mechanical fall with small subdural hematoma and rib fractures.  She is currently on hospice care.   Physical Exam   Triage Vital Signs: ED Triage Vitals  Encounter Vitals Group     BP      Systolic BP Percentile      Diastolic BP Percentile      Pulse      Resp      Temp      Temp src      SpO2      Weight      Height      Head Circumference      Peak Flow      Pain Score      Pain Loc      Pain Education      Exclude from Growth Chart     Most recent vital signs: Vitals:   05/08/23 0956 05/08/23 1000  BP:  133/73  Pulse:  94  Resp:  (!) 27  Temp: 97.9 F (36.6 C)   SpO2:  100%     General: Alert, oriented x 1, uncomfortable appearing. CV:  Good peripheral perfusion.  Resp:  Normal effort.  Abd:  Soft with no focal tenderness.  No distention.  Other:  Motor intact in all extremities.  Dry mucous membranes.  No peripheral edema.   ED Results / Procedures / Treatments   Labs (all labs ordered are listed, but only abnormal results are displayed) Labs Reviewed  COMPREHENSIVE METABOLIC PANEL - Abnormal; Notable for  the following components:      Result Value   CO2 18 (*)    Glucose, Bld 114 (*)    BUN 51 (*)    Calcium 8.1 (*)    Total Protein 5.4 (*)    Albumin 3.1 (*)    All other components within normal limits  CBC WITH DIFFERENTIAL/PLATELET - Abnormal; Notable for the following components:   WBC 10.6 (*)    RBC 2.67 (*)    Hemoglobin 8.1 (*)    HCT 24.5 (*)    Neutro Abs 7.8 (*)    Abs Immature Granulocytes 0.09 (*)    All other components within normal limits  LACTIC ACID, PLASMA  LACTIC ACID, PLASMA  URINALYSIS, ROUTINE W REFLEX MICROSCOPIC  HEMOGLOBIN AND HEMATOCRIT, BLOOD  HEMOGLOBIN AND HEMATOCRIT, BLOOD  TYPE AND SCREEN  TROPONIN I (HIGH SENSITIVITY)  TROPONIN I (HIGH SENSITIVITY)     EKG  ED ECG REPORT I, Dionne Bucy, the attending physician, personally viewed and interpreted this ECG.  Date: 05/08/2023 EKG Time: 0902 Rate: 105  Rhythm: Sinus tachycardia QRS Axis: Left axis Intervals: Prolonged QTc ST/T Wave abnormalities: normal Narrative Interpretation: no evidence of acute ischemia    RADIOLOGY     PROCEDURES:  Critical Care performed: No  Procedures   MEDICATIONS ORDERED IN ED: Medications  sodium chloride 0.9 % bolus 500 mL (500 mLs Intravenous New Bag/Given 05/08/23 0939)  fentaNYL (SUBLIMAZE) injection 50 mcg (50 mcg Intravenous Given 05/08/23 0952)     IMPRESSION / MDM / ASSESSMENT AND PLAN / ED COURSE  I reviewed the triage vital signs and the nursing notes.  89 year old female with PMH as noted above on hospice care presents with a syncopal episode as well as dark tarry stool.  She appears uncomfortable and is complaining of pain "everywhere," but exam is otherwise negative for focal findings.  Differential diagnosis includes, but is not limited to, acute GI bleed, colitis, diverticulitis, UTI, other infection, dehydration, hypovolemia, electrolyte abnormality, other metabolic cause,.  I have a low suspicion for cardiac etiology.  We  will obtain lab workup, give a fluid bolus, and reassess.  Patient's presentation is most consistent with acute presentation with potential threat to life or bodily function.  The patient is on the cardiac monitor to evaluate for evidence of arrhythmia and/or significant heart rate changes.  ----------------------------------------- 11:07 AM on 05/08/2023 -----------------------------------------  Lab workup is significant for hemoglobin of 8.1 which is down from a baseline around 11.  Electrolytes are unremarkable.  Troponin is negative.  Lactate is normal.  The patient's vital signs have remained stable.  I had a discussion with the son about the patient's current condition and goals of care.  The son is amenable to noninvasive treatment such as blood transfusion if the patient had a further drop in her hemoglobin and it became indicated, however likely would not want any invasive workup or treatment such as endoscopy or any surgical intervention.  If the patient had further deterioration she would likely transition to comfort care.  Therefore at this time we will admit to the hospitalist service for serial hemoglobins and further monitoring.  I consulted Dr. Chipper Herb; based on our discussion he agrees to evaluate the patient for admission.   FINAL CLINICAL IMPRESSION(S) / ED DIAGNOSES   Final diagnoses:  Syncope, unspecified syncope type  Gastrointestinal hemorrhage, unspecified gastrointestinal hemorrhage type     Rx / DC Orders   ED Discharge Orders     None        Note:  This document was prepared using Dragon voice recognition software and may include unintentional dictation errors.    Dionne Bucy, MD 05/08/23 1109

## 2023-05-08 NOTE — ED Triage Notes (Signed)
Pt to ED via [facility] from [place]. Pt had witnessed syncopal episisode and assisted to floor by staff. Pt is hospice pt. EMS reports black tarry stool noted. Pt oxygen 87-96% on Ra with EMS. On arrival pt covered in dark tarry stool and yelling stating pain is all over.   EMS VS:  BP 123/64 CBG 169 RR 24 ET 28

## 2023-05-08 NOTE — Progress Notes (Addendum)
[facility]- [employer] [facility]  Patient is a current Hospice patient with [facility].  [facility] will follow patient and assist with any disposition needs.    Please don't hesitate to call with any Hospice related questions or concerns.    Thank you for the opportunity to participate in this patient's care. [facility] Liaison [phone]

## 2023-05-08 NOTE — Progress Notes (Addendum)
[facility]IZED HOSPICE PATIENT  Madeline Hernandez is a current hospice patient followed at [facility] (memory care unit) for terminal diagnosis of CVD.  She was sent to the ED from the facility for evaluation of complaint of black stools and syncopal episode.  Per. Dr. Alphonsus Sias, hospice physician, this is a related hospital admission.  Patient lying in bed with eyes closed.  Patient has been medicated with IV dilaudid and IV fentanyl for pain.  Patient will be transferred to room 243.    Patient remains GIP appropriate due to GI Bleed requiring frequent assessment and monitoring of interventions.  Patient requiring IV pain meds for pain and IV PPI's for bleed.  Vital Signs:   T 97.9 (A), BP 128/93, P 110, R 28 Oxi RA 98%  Abnormal labs WBC 10.6, NEUTROABS 7.8, HGB 8.1, HCT 24.5, CO2 18, Glucose 114, BUN 41, CA 8.1, PROT 5.4, ALBUMIN 3.1  Diagnostics:  none  IV/PRN Meds:  Protonix 8mg /hour gtt Dilaudid 0.5mg  IV  @ 1226 (pain) Zyprexa 2.5mg  IM @ 1304 (agitation) Fentanyl IV @ 0952 (pain)  Hospital Problem list:  per H&P   Acute blood loss anemia Melena -Clinically suspect recurrent peptic ulcer bleeding as patient family reported the patient had a history of such peptic ulcer bleeding required endoscopy and transfusion about 20 years ago. BUN/Cre ratio>30 -Confirmed with family that patient does not wish any invasive procedure including endoscopy or colonoscopy. -Type and screen ordered, family also agreed with transfusion if necessary -Start PPI bolus and PPI drip -NPO and IV fluid -Hold off home BP meds, start as needed hydralazine -Overall prognosis is poor, family made aware, and confirmed that patient wishes DNR.   2.  Syncope -Likely vasovagal secondary to GI bleed, management as above.   3.  HTN -As above   4.  Frequent falls -Baseline has unsteady gait and with recent frequent falls patient was put on hospice.     5.  Advanced  dementia -Mentation at baseline  Discharge Planning:  Ongoing-   Family contact:  Son at bedside   IDT:  Updated    Goals of Care- clear, DNR.  Family does not want any invasive procedures including endoscopy or colonoscopy.  Agreed to transfusion if necessary with repeat H&H today.  Medication list given to ED secretary to place on chart.   Norris Cross, RN Nurse Liaison [phone]

## 2023-05-08 NOTE — ED Notes (Addendum)
Family at nurses station stating pt pulled out IV. This RN and Social research officer, government at bedside. New IV placed and type and screen collected. IV wrapped in Coban and mesh sleeve

## 2023-05-08 NOTE — ED Notes (Addendum)
RN exited room to grab supplies. Upon entering the room again pt had taken off safety mitts, pulled out IV and taken brief off. Another RN at bedside to assist this RN. New IV placed, brief changed and pt taken to 5h to be visualized at nurses station.

## 2023-05-08 NOTE — ED Notes (Signed)
Family at bedside. 

## 2023-05-08 NOTE — ED Notes (Signed)
Family left bedside. Pt continues to pull off monitoring cords and continues to attempt to pull out IV. Pt also continues to stick her hands in her feces. Pt cleaned and new brief in place. Mitts placed on pt to ensure safe environment and proper monitoring and IV access can be ongoing.

## 2023-05-09 DIAGNOSIS — K921 Melena: Principal | ICD-10-CM

## 2023-05-09 LAB — BASIC METABOLIC PANEL
Anion gap: 6 (ref 5–15)
BUN: 28 mg/dL — ABNORMAL HIGH (ref 8–23)
CO2: 20 mmol/L — ABNORMAL LOW (ref 22–32)
Calcium: 7.8 mg/dL — ABNORMAL LOW (ref 8.9–10.3)
Chloride: 111 mmol/L (ref 98–111)
Creatinine, Ser: 0.71 mg/dL (ref 0.44–1.00)
GFR, Estimated: >60 mL/min (ref >60)
Glucose, Bld: 81 mg/dL (ref 70–99)
Potassium: 3.3 mmol/L — ABNORMAL LOW (ref 3.5–5.1)
Sodium: 137 mmol/L (ref 135–145)

## 2023-05-09 LAB — CBC
HCT: 18.6 % — ABNORMAL LOW (ref 36.0–46.0)
Hemoglobin: 6.1 g/dL — ABNORMAL LOW (ref 12.0–15.0)
MCH: 29.8 pg (ref 26.0–34.0)
MCHC: 32.8 g/dL (ref 30.0–36.0)
MCV: 90.7 fL (ref 80.0–100.0)
Platelets: 155 10*3/uL (ref 150–400)
RBC: 2.05 MIL/uL — ABNORMAL LOW (ref 3.87–5.11)
RDW: 13.8 % (ref 11.5–15.5)
WBC: 5.7 10*3/uL (ref 4.0–10.5)
nRBC: 0 % (ref 0.0–0.2)

## 2023-05-09 LAB — PREPARE RBC (CROSSMATCH)

## 2023-05-09 LAB — HEMOGLOBIN AND HEMATOCRIT, BLOOD
HCT: 27.6 % — ABNORMAL LOW (ref 36.0–46.0)
Hemoglobin: 9.4 g/dL — ABNORMAL LOW (ref 12.0–15.0)

## 2023-05-09 LAB — ABO/RH: ABO/RH(D): A POS

## 2023-05-09 MED ORDER — POTASSIUM CHLORIDE CRYS ER 20 MEQ PO TBCR: 40.0000 meq | EXTENDED_RELEASE_TABLET | Freq: Once | ORAL | Status: DC

## 2023-05-09 MED ORDER — LORAZEPAM 2 MG/ML IJ SOLN
0.5000 mg | Freq: Once | INTRAMUSCULAR | Status: AC
  Administered 2023-05-09: 0.5 mg via INTRAVENOUS
  Filled 2023-05-09: qty 1

## 2023-05-09 MED ORDER — HALOPERIDOL LACTATE 5 MG/ML IJ SOLN
2.0000 mg | Freq: Four times a day (QID) | INTRAMUSCULAR | Status: DC | PRN
  Administered 2023-05-10: 2 mg via INTRAMUSCULAR
  Filled 2023-05-09: qty 1

## 2023-05-09 MED ORDER — SODIUM CHLORIDE 0.9% IV SOLUTION: Freq: Once | INTRAVENOUS | Status: AC

## 2023-05-09 MED ORDER — LORAZEPAM 2 MG/ML IJ SOLN
1.0000 mg | Freq: Four times a day (QID) | INTRAMUSCULAR | Status: DC | PRN
  Administered 2023-05-09: 1 mg via INTRAVENOUS
  Filled 2023-05-09 (×2): qty 1

## 2023-05-09 MED ORDER — POLYVINYL ALCOHOL 1.4 % OP SOLN
2.0000 [drp] | OPHTHALMIC | Status: DC | PRN
  Filled 2023-05-09: qty 15

## 2023-05-09 NOTE — Plan of Care (Signed)

## 2023-05-09 NOTE — Progress Notes (Signed)
PROGRESS NOTE    Madeline Hernandez  GMW:[id number] DOB: 1933-04-29 DOA: 05/08/2023 PCP: System, Provider Not In    Assessment & Plan:   Principal Problem:   GI bleed Active Problems:   Melena   Acute blood loss anemia  Assessment and Plan: Acute blood loss anemia: likely secondary to GI hemorrhage. Having melena. Hx of PUD. Fam does not want any invasive procedure including endoscopy or colonoscopy but transfusions if needed are ok. 2 units of pRBCs have been ordered. Continue on PPI. Repeat H&H ordered 4 hours post transfusions    Syncope: likely secondary to GI hemorrhage.    HTN: BP is actually hypotensive secondary to GI hemorrhage. Holding all HTN meds    Frequent falls: unsteady gait & frequent fall at baseline, so pt was put on hospice   Advanced dementia: continue w/ supportive care         DVT prophylaxis: SCDs Code Status: DNR Family Communication: discussed pt's care w/ pt's family at bedside and answered their questions  Disposition Plan: unclear  Level of care: Progressive Status is: Inpatient Remains inpatient appropriate because: severity of illness    Consultants:  Hospice   Procedures:   Antimicrobials:   Subjective: Pt appears pleasantly confused   Objective: Vitals:   05/08/23 2033 05/08/23 2316 05/09/23 0327 05/09/23 0829  BP: (!) 109/42 122/65 112/69 (!) 99/43  Pulse: 83 97 (!) 102 74  Resp: 20 18 18 20   Temp: 97.8 F (36.6 C) 98.8 F (37.1 C) 98 F (36.7 C) 98.6 F (37 C)  TempSrc: Axillary  Oral Oral  SpO2: 95% 91% 100% 91%    Intake/Output Summary (Last 24 hours) at 05/09/2023 0900 Last data filed at 05/09/2023 0411 Gross per 24 hour  Intake 1655.02 ml  Output 200 ml  Net 1455.02 ml   There were no vitals filed for this visit.  Examination:  General exam: Appears uncomfortable  Respiratory system: Clear to auscultation. Respiratory effort normal. Cardiovascular system: S1 & S2+. No rubs, gallops or clicks.   Gastrointestinal system: Abdomen is nondistended, soft and tenderness to palpation.Hyperactive bowel sounds heard. Central nervous system: lethargic  Psychiatry: Judgement and insight appears     Data Reviewed: I have personally reviewed following labs and imaging studies  CBC: Recent Labs  Lab 05/08/23 0930 05/08/23 1519 05/08/23 2208 05/09/23 0507  WBC 10.6*  --   --  5.7  NEUTROABS 7.8*  --   --   --   HGB 8.1* 7.8* 6.7* 6.1*  HCT 24.5* 24.0* 20.2* 18.6*  MCV 91.8  --   --  90.7  PLT 206  --   --  155   Basic Metabolic Panel: Recent Labs  Lab 05/08/23 0930 05/09/23 0507  NA 135 137  K 4.8 3.3*  CL 103 111  CO2 18* 20*  GLUCOSE 114* 81  BUN 51* 28*  CREATININE 0.67 0.71  CALCIUM 8.1* 7.8*   GFR: CrCl cannot be calculated (Unknown ideal weight.). Liver Function Tests: Recent Labs  Lab 05/08/23 0930  AST 22  ALT 11  ALKPHOS 40  BILITOT 1.2  PROT 5.4*  ALBUMIN 3.1*   No results for input(s): "LIPASE", "AMYLASE" in the last 168 hours. No results for input(s): "AMMONIA" in the last 168 hours. Coagulation Profile: No results for input(s): "INR", "PROTIME" in the last 168 hours. Cardiac Enzymes: No results for input(s): "CKTOTAL", "CKMB", "CKMBINDEX", "TROPONINI" in the last 168 hours. BNP (last 3 results) No results for input(s): "PROBNP" in the last 8760  hours. HbA1C: No results for input(s): "HGBA1C" in the last 72 hours. CBG: No results for input(s): "GLUCAP" in the last 168 hours. Lipid Profile: No results for input(s): "CHOL", "HDL", "LDLCALC", "TRIG", "CHOLHDL", "LDLDIRECT" in the last 72 hours. Thyroid Function Tests: No results for input(s): "TSH", "T4TOTAL", "FREET4", "T3FREE", "THYROIDAB" in the last 72 hours. Anemia Panel: Recent Labs    05/08/23 1729  FERRITIN 52  TIBC 283  IRON 81  RETICCTPCT 2.5   Sepsis Labs: Recent Labs  Lab 05/08/23 0930  LATICACIDVEN 1.5    No results found for this or any previous visit (from the past  240 hour(s)).       Radiology Studies: No results found.      Scheduled Meds:  sodium chloride   Intravenous Once   FLUoxetine  30 mg Oral Daily   fluticasone furoate-vilanterol  1 puff Inhalation Daily   levothyroxine  125 mcg Oral Q0600   magnesium oxide  400 mg Oral Daily   melatonin  5 mg Oral QHS   [START ON 05/11/2023] pantoprazole  40 mg Intravenous Q12H   pravastatin  10 mg Oral q1800   umeclidinium bromide  1 puff Inhalation Daily   Continuous Infusions:  sodium chloride 100 mL/hr at 05/09/23 0411   pantoprazole 8 mg/hr (05/09/23 0411)     LOS: 1 day       Charise Killian, MD [facility] Pager [phone]  If 7PM-7AM, please contact night-coverage [email or web address] Password [email or web address] 05/09/2023, 9:00 AM

## 2023-05-09 NOTE — Progress Notes (Signed)
[facility] HOSPICE PATIENT   [name] is a current hospice patient followed at [facility] (memory care unit) for terminal diagnosis of CVD.  She was sent to the ED from the facility for evaluation of complaint of black stools and syncopal episode.  Per. Dr. Alphonsus Sias, hospice physician, this is a related hospital admission.   Patient lying in bed with eyes closed.  Does not arouse to verbal stimulation.  Patient's son, Madeline Hernandez, is at the bedside.  Discussed patient's current status and reviewed goals of care.  Patient just completed transfusion of 1u PRBC.  Will recheck hgb/hct in 4 hours.  If counts continue to drop, Madeline Hernandez, endorses transitioning patient to full comfort care.  Reviewed hospital liaison role and he verbalized understanding of calling this RN for any hospice related questions or concerns as I am covering through Monday.  HLT will continue to follow through final disposition.     Patient remains GIP appropriate due to GI Bleed requiring frequent assessment and monitoring of interventions.  Patient requiring IV pain meds for pain and IV PPI's for bleed.  Patient received blood transfusion today.   Vital Signs:   T 97.8, BP 102/91, P 86, R 16, Oxi 91 RA   Abnormal labs:  K 3.3, CO2 20, BUN 28, Calcium 7.8, RBC 2.05, HGB 6.1, HCT 18.6   Diagnostics:  none   IV/PRN Meds:  Protonix 8mg /hour gtt Ativan 0.5mg  x 1 @ 1218 Dilaudid 0.5mg  IV 9.20.24 @ 1226 & 2202 Zyprexa 2.5mg  IV 9.20.24 @ 1304 & 2202   Hospital Problem list:  per H&P    Acute blood loss anemia Melena -Clinically suspect recurrent peptic ulcer bleeding as patient family reported the patient had a history of such peptic ulcer bleeding required endoscopy and transfusion about 20 years ago. BUN/Cre ratio>30 -Confirmed with family that patient does not wish any invasive procedure including endoscopy or colonoscopy. -Type and screen ordered, family also agreed with transfusion if  necessary -Start PPI bolus and PPI drip -NPO and IV fluid -Hold off home BP meds, start as needed hydralazine -Overall prognosis is poor, family made aware, and confirmed that patient wishes DNR. - Transfused 1 U PRBC this morning at 1058.    2.  Syncope -Likely vasovagal secondary to GI bleed, management as above.   3.  HTN -As above   4.  Frequent falls -Baseline has unsteady gait and with recent frequent falls patient was put on hospice.     5.  Advanced dementia -Mentation at baseline   Discharge Planning:  Ongoing-    Family contact:  Son at bedside- see above   IDT:  both hospice and hospital IDT updated.  Continued collaboration with family, hospice IDT and hospital IDT ongoing.   Goals of Care- clear, DNR.  Family does not want any invasive procedures including endoscopy or colonoscopy.  Will plan transition to full comfort measures if blood counts continue to drop.  Norris Cross, RN Nurse Liaison [phone]

## 2023-05-10 DIAGNOSIS — K921 Melena: Secondary | ICD-10-CM | POA: Diagnosis not present

## 2023-05-10 LAB — BPAM RBC
Blood Product Expiration Date: 202410172359
Blood Product Expiration Date: 202410172359
ISSUE DATE / TIME: 202409211042
ISSUE DATE / TIME: 202409211350
Unit Type and Rh: 6200
Unit Type and Rh: 6200

## 2023-05-10 LAB — TYPE AND SCREEN
ABO/RH(D): A POS
Antibody Screen: NEGATIVE
Unit division: 0
Unit division: 0

## 2023-05-10 LAB — CBC
HCT: 31.4 % — ABNORMAL LOW (ref 36.0–46.0)
Hemoglobin: 10.9 g/dL — ABNORMAL LOW (ref 12.0–15.0)
MCH: 30.5 pg (ref 26.0–34.0)
MCHC: 34.7 g/dL (ref 30.0–36.0)
MCV: 88 fL (ref 80.0–100.0)
Platelets: 163 10*3/uL (ref 150–400)
RBC: 3.57 MIL/uL — ABNORMAL LOW (ref 3.87–5.11)
RDW: 13.9 % (ref 11.5–15.5)
WBC: 6.5 10*3/uL (ref 4.0–10.5)
nRBC: 0 % (ref 0.0–0.2)

## 2023-05-10 MED ORDER — MORPHINE SULFATE (CONCENTRATE) 10 MG/0.5ML PO SOLN: 5.0000 mg | ORAL | Status: DC | PRN

## 2023-05-10 MED ORDER — LORAZEPAM 2 MG/ML PO CONC
1.0000 mg | ORAL | Status: DC | PRN
  Administered 2023-05-11: 1 mg via SUBLINGUAL
  Filled 2023-05-10 (×3): qty 1

## 2023-05-10 MED ORDER — MORPHINE SULFATE (CONCENTRATE) 10 MG/0.5ML PO SOLN
5.0000 mg | ORAL | Status: DC | PRN
  Administered 2023-05-10 – 2023-05-11 (×2): 5 mg via SUBLINGUAL
  Filled 2023-05-10 (×2): qty 0.5

## 2023-05-10 MED ORDER — GLYCOPYRROLATE 0.2 MG/ML IJ SOLN: 0.2000 mg | INTRAMUSCULAR | Status: DC | PRN

## 2023-05-10 MED ORDER — LORAZEPAM 2 MG/ML PO CONC
1.0000 mg | ORAL | Status: DC | PRN
  Administered 2023-05-10 (×2): 1 mg via ORAL

## 2023-05-10 MED ORDER — GLYCOPYRROLATE 1 MG PO TABS: 1.0000 mg | ORAL_TABLET | ORAL | Status: DC | PRN

## 2023-05-10 NOTE — Progress Notes (Signed)
PROGRESS NOTE    Madeline Hernandez  ZOX:[id number] DOB: Oct 03, 1932 DOA: 05/08/2023 PCP: System, Provider Not In    Assessment & Plan:   Principal Problem:   GI bleed Active Problems:   Melena   Acute blood loss anemia  Assessment and Plan: Failure to thrive: secondary to all below. Discuss pt's care w/ pt's family at bedside who decided to make pt comfort care only.   Acute blood loss anemia: likely secondary to GI hemorrhage. Having melena. Hx of PUD. Fam does not want any invasive procedure including endoscopy or colonoscopy. S/p 2 units of pRBCs transfused. Repeat H&H are trending up. Comfort care only    Syncope: likely secondary to GI hemorrhage.    HTN: BP is actually hypotensive secondary to GI hemorrhage. Holding all HTN meds    Frequent falls: unsteady gait & frequent fall at baseline, so pt was put on hospice   Advanced dementia: continue w/ supportive care         DVT prophylaxis: SCDs Code Status: DNR Family Communication: discussed pt's care w/ pt's family at bedside and answered their questions  Disposition Plan: unclear  Level of care: Progressive Status is: Inpatient Remains inpatient appropriate because: severity of illness    Consultants:  Hospice   Procedures:   Antimicrobials:   Subjective: Pt is sleeping.   Objective: Vitals:   05/09/23 1946 05/09/23 2337 05/10/23 0123 05/10/23 0348  BP: (!) 102/46 (!) 156/70  (!) 160/78  Pulse: 79  74 89  Resp: 18 18  18   Temp: 98.6 F (37 C)  98.1 F (36.7 C) 98.2 F (36.8 C)  TempSrc: Oral   Oral  SpO2: 93%  92% 93%    Intake/Output Summary (Last 24 hours) at 05/10/2023 8119 Last data filed at 05/10/2023 0127 Gross per 24 hour  Intake 320 ml  Output 800 ml  Net -480 ml   There were no vitals filed for this visit.  Examination:  General exam:appears calm and comfortable Respiratory system: decreased breath sounds b/l  Cardiovascular system: S1/S2+ Gastrointestinal system: Abd is  soft, NT, ND & hypoactive bowel sounds Central nervous system: lethargic  Psychiatry: Judgement and insight appears poor    Data Reviewed: I have personally reviewed following labs and imaging studies  CBC: Recent Labs  Lab 05/08/23 0930 05/08/23 1519 05/08/23 2208 05/09/23 0507 05/09/23 1953 05/10/23 0611  WBC 10.6*  --   --  5.7  --  6.5  NEUTROABS 7.8*  --   --   --   --   --   HGB 8.1* 7.8* 6.7* 6.1* 9.4* 10.9*  HCT 24.5* 24.0* 20.2* 18.6* 27.6* 31.4*  MCV 91.8  --   --  90.7  --  88.0  PLT 206  --   --  155  --  163   Basic Metabolic Panel: Recent Labs  Lab 05/08/23 0930 05/09/23 0507  NA 135 137  K 4.8 3.3*  CL 103 111  CO2 18* 20*  GLUCOSE 114* 81  BUN 51* 28*  CREATININE 0.67 0.71  CALCIUM 8.1* 7.8*   GFR: CrCl cannot be calculated (Unknown ideal weight.). Liver Function Tests: Recent Labs  Lab 05/08/23 0930  AST 22  ALT 11  ALKPHOS 40  BILITOT 1.2  PROT 5.4*  ALBUMIN 3.1*   No results for input(s): "LIPASE", "AMYLASE" in the last 168 hours. No results for input(s): "AMMONIA" in the last 168 hours. Coagulation Profile: No results for input(s): "INR", "PROTIME" in the last 168 hours. Cardiac  Enzymes: No results for input(s): "CKTOTAL", "CKMB", "CKMBINDEX", "TROPONINI" in the last 168 hours. BNP (last 3 results) No results for input(s): "PROBNP" in the last 8760 hours. HbA1C: No results for input(s): "HGBA1C" in the last 72 hours. CBG: No results for input(s): "GLUCAP" in the last 168 hours. Lipid Profile: No results for input(s): "CHOL", "HDL", "LDLCALC", "TRIG", "CHOLHDL", "LDLDIRECT" in the last 72 hours. Thyroid Function Tests: No results for input(s): "TSH", "T4TOTAL", "FREET4", "T3FREE", "THYROIDAB" in the last 72 hours. Anemia Panel: Recent Labs    05/08/23 1729  FERRITIN 52  TIBC 283  IRON 81  RETICCTPCT 2.5   Sepsis Labs: Recent Labs  Lab 05/08/23 0930  LATICACIDVEN 1.5    No results found for this or any previous visit  (from the past 240 hour(s)).       Radiology Studies: No results found.      Scheduled Meds:  FLUoxetine  30 mg Oral Daily   fluticasone furoate-vilanterol  1 puff Inhalation Daily   levothyroxine  125 mcg Oral Q0600   magnesium oxide  400 mg Oral Daily   melatonin  5 mg Oral QHS   [START ON 05/11/2023] pantoprazole  40 mg Intravenous Q12H   pravastatin  10 mg Oral q1800   umeclidinium bromide  1 puff Inhalation Daily   Continuous Infusions:  pantoprazole 8 mg/hr (05/10/23 0043)     LOS: 2 days       Charise Killian, MD [facility] Pager [phone]  If 7PM-7AM, please contact night-coverage [email or web address] 05/10/2023, 9:21 AM

## 2023-05-10 NOTE — Plan of Care (Signed)
Patient has been made comfort care.

## 2023-05-10 NOTE — Progress Notes (Addendum)
[facility] COLLECTIVE HOSPITALIZED HOSPICE PATIENT   Madeline Hernandez is a current hospice patient followed at [facility] (memory care unit) for terminal diagnosis of CVD.  She was sent to the ED from the facility for evaluation of complaint of black stools and syncopal episode.  Per. Dr. Alphonsus Sias, hospice physician, this is a related hospital admission.  Patient resting in bed.  HGB & HCT up from blood transfusion yesterday.  Son at bedside.  No complaints.  Agrees to hospital discharge tomorrow- back to [facility].  Patient remains GIP appropriate due to GI Bleed requiring frequent assessment and monitoring of interventions.  Patient requiring IV meds for GI bleed, pain and agitation  Vital Signs:   T 97.8, BP 160/78, P 103, R 168 Oxi 93% RA   Abnormal labs:  RBC 3.57, HGB 10.9, HCT 31.4,   Diagnostics:  none  IV/PRN Meds:  Protonix 8mg /hour gtt Dilaudid 0.5mg  IV @ 0004 Zyprexa 2.5mg  IV @ 1500 on 9.21.24 Hydralazine 2mg  IV @ 0404 Haldol 2mg  IM @ 0005 Ativan 1mg  IV @ 1804 on 9.21.24- agitation   Hospital Problem list:  per H&P    Acute blood loss anemia Melena -Clinically suspect recurrent peptic ulcer bleeding as patient family reported the patient had a history of such peptic ulcer bleeding required endoscopy and transfusion about 20 years ago. BUN/Cre ratio>30 -Confirmed with family that patient does not wish any invasive procedure including endoscopy or colonoscopy. -Type and screen ordered, family also agreed with transfusion if necessary -Start PPI bolus and PPI drip -NPO and IV fluid -Hold off home BP meds, start as needed hydralazine -Overall prognosis is poor, family made aware, and confirmed that patient wishes DNR.   2.  Syncope -Likely vasovagal secondary to GI bleed, management as above.   3.  HTN -As above   4.  Frequent falls -Baseline has unsteady gait and with recent frequent falls patient was put on hospice.     5.  Advanced  dementia -Mentation at baseline   Discharge Planning:  Plan to discharge back to [facility] tomorrow with continued hospice services.   Family contact:  Son at bedside   IDT:  both hospice and hospital IDT updated.  Continued collaboration with family, hospice IDT and hospital IDT ongoing.   Goals of Care- clear, DNR.  Family does not want any invasive procedures including endoscopy or colonoscopy.  Will plan transition to full comfort measures if blood counts continue to drop.  Norris Cross, RN Nurse Liaison [phone]

## 2023-05-10 NOTE — Plan of Care (Signed)
Problem: Education: Goal: Knowledge of General Education information will improve Description: Including pain rating scale, medication(s)/side effects and non-pharmacologic comfort measures Outcome: Not Applicable   Problem: Health Behavior/Discharge Planning: Goal: Ability to manage health-related needs will improve Outcome: Not Applicable   Problem: Clinical Measurements: Goal: Ability to maintain clinical measurements within normal limits will improve Outcome: Not Applicable Goal: Will remain free from infection Outcome: Not Applicable Goal: Diagnostic test results will improve Outcome: Not Applicable Goal: Respiratory complications will improve Outcome: Not Applicable Goal: Cardiovascular complication will be avoided Outcome: Not Applicable   Problem: Activity: Goal: Risk for activity intolerance will decrease Outcome: Not Applicable   Problem: Nutrition: Goal: Adequate nutrition will be maintained Outcome: Not Applicable   Problem: Coping: Goal: Level of anxiety will decrease Outcome: Not Applicable   Problem: Elimination: Goal: Will not experience complications related to bowel motility Outcome: Not Applicable Goal: Will not experience complications related to urinary retention Outcome: Not Applicable   Problem: Pain Managment: Goal: General experience of comfort will improve Outcome: Not Applicable   Problem: Safety: Goal: Ability to remain free from injury will improve Outcome: Not Applicable   Problem: Skin Integrity: Goal: Risk for impaired skin integrity will decrease Outcome: Not Applicable   Problem: Education: Goal: Knowledge of the prescribed therapeutic regimen will improve Outcome: Not Applicable   Problem: Coping: Goal: Ability to identify and develop effective coping behavior will improve Outcome: Not Applicable   Problem: Clinical Measurements: Goal: Quality of life will improve Outcome: Not Applicable   Problem: Respiratory: Goal:  Verbalizations of increased ease of respirations will increase Outcome: Not Applicable   Problem: Role Relationship: Goal: Family's ability to cope with current situation will improve Outcome: Not Applicable Goal: Ability to verbalize concerns, feelings, and thoughts to partner or family member will improve Outcome: Not Applicable   Problem: Pain Management: Goal: Satisfaction with pain management regimen will improve Outcome: Not Applicable

## 2023-05-11 DIAGNOSIS — R627 Adult failure to thrive: Secondary | ICD-10-CM | POA: Diagnosis not present

## 2023-05-11 DIAGNOSIS — K921 Melena: Secondary | ICD-10-CM | POA: Diagnosis not present

## 2023-05-11 MED ORDER — LORAZEPAM 2 MG/ML PO CONC: 1.0000 mg | ORAL | 0 refills | Status: AC | PRN

## 2023-05-11 MED ORDER — MORPHINE SULFATE (CONCENTRATE) 10 MG/0.5ML PO SOLN: 5.0000 mg | ORAL | 0 refills | Status: AC | PRN

## 2023-05-11 NOTE — TOC Transition Note (Addendum)
Transition of Care [facility]) - CM/SW Discharge Note   Patient Details  Name: Madeline Hernandez MRN: [id number] Date of Birth: [birth year]  Transition of Care [facility]) CM/SW Contact:  Truddie Hidden, RN Phone Number: 05/11/2023, 1:07 PM   Clinical Narrative:    Sherron Monday with Narda Bonds at [facility] Patient admittance back to facility confirmed for today Patient assigned room 86 Nurse will call report to [phone] EMS arranged. "She's third on the list." Patient's son notified of discharge and that EMS had been arranged  Face sheet and medical necessity forms printed to the floor to be added to the EMS pack. Per MD DNR signed and on the chart.  Discharge summary and FL2 faxed per Justyce's request to [phone]. Per transmittal notice faxed received ok.  3:17 Updated FL2 requested bby Pam at [facility] to reflect memory care. FL2 updated and faxed to [phone].   TOC signing off.           Patient Goals and CMS Choice      Discharge Placement                         Discharge Plan and Services Additional resources added to the After Visit Summary for                                       Social Determinants of Health (SDOH) Interventions SDOH Screenings   Food Insecurity: No Food Insecurity (08/12/2022)  Housing: Low Risk  (08/12/2022)  Transportation Needs: No Transportation Needs (08/12/2022)  Utilities: Not At Risk (08/12/2022)  Tobacco Use: Low Risk  (08/12/2022)     Readmission Risk Interventions     No data to display

## 2023-05-11 NOTE — Progress Notes (Signed)
Report called to [facility] and [facility] nurses at [facility], family at bedside aware of the transfer. Pt is in comfort care and shows no signs of pain.

## 2023-05-11 NOTE — Discharge Summary (Signed)
Physician Discharge Summary  Madeline Hernandez ZOX:[id number] DOB: 1933/01/11 DOA: 05/08/2023  PCP: System, Provider Not In  Admit date: 05/08/2023 Discharge date: 05/11/2023  Admitted From: Madeline Hernandez w/ hospice/comfort care only  Disposition:  Madeline Hernandez w/ hospice/comfort care only   Recommendations for Outpatient Follow-up:  Follow up with hospice provider ASAP  Home Health: no  Equipment/Devices:  Discharge Condition: hospice CODE STATUS: DNR Diet recommendation: As tolerated   Brief/Interim Summary: HPI was taken from Dr. Irving Hernandez:  Madeline Hernandez is a 89 y.o. female with medical history significant of remote history of GI bleed secondary to peptic ulcer, COPD, dementia, HTN, frequent falls, sent from nursing home for evaluation of syncope and black tarry stool.   Patient has dementia and unable to provide any history, all history provided by son at bedside.  At baseline patient resides at a memory unit of nursing home.  This morning, patient pushed breakfast away and appeared to have severe abdominal pain.  Later it was found the patient was unconsciousness and lying the bed with dark-colored feces on bed sheet.   ED Course: Tachycardia nonhypotensive, blood work showed hemoglobin 8.1 compared to baseline 11.7   Patient was given IV bolus and Zofran in the ED. ED patient discussed case with patient family at bedside who expressed patient does not wish any invasive measures including endoscopy.  Discharge Diagnoses:  Principal Problem:   GI bleed Active Problems:   Melena   Acute blood loss anemia  Failure to thrive: secondary to all below. Discuss pt's care w/ pt's family at bedside who decided to make pt comfort care only.   Acute blood loss anemia: likely secondary to GI hemorrhage. Having melena. Hx of PUD. Fam does not want any invasive procedure including endoscopy or colonoscopy. S/p 2 units of pRBCs transfused. Repeat H&H are trending up. Comfort care only    Syncope:  likely secondary to GI hemorrhage.    HTN: BP is actually hypotensive secondary to GI hemorrhage. Holding all HTN meds    Frequent falls: unsteady gait & frequent fall at baseline, so pt was put on hospice   Advanced dementia: continue w/ supportive care     Discharge Instructions  Discharge Instructions     Discharge instructions   Complete by: As directed    F/u w/ hospice provider as soon as possible   Increase activity slowly   Complete by: As directed       Allergies as of 05/11/2023       Reactions   Depakote [divalproex Sodium] Other (See Comments)   Penicillins    Sulfa Antibiotics Other (See Comments)        Medication List     STOP taking these medications    amLODipine 5 MG tablet Commonly known as: NORVASC   aspirin 81 MG chewable tablet   Ativan 0.5 MG tablet Generic drug: LORazepam Replaced by: LORazepam 2 MG/ML concentrated solution   FLUoxetine 10 MG capsule Commonly known as: PROZAC   FLUoxetine 20 MG capsule Commonly known as: PROZAC   levothyroxine 125 MCG tablet Commonly known as: SYNTHROID   lovastatin 20 MG tablet Commonly known as: MEVACOR   magnesium oxide 400 MG tablet Commonly known as: MAG-OX   Melatonin 10 MG Tabs   oxyCODONE 5 MG immediate release tablet Commonly known as: Oxy IR/ROXICODONE   PreserVision AREDS 2 Caps       TAKE these medications    albuterol 108 (90 Base) MCG/ACT inhaler Commonly known as: VENTOLIN HFA Inhale 2 puffs  into the lungs every 6 (six) hours as needed for wheezing or shortness of breath.   barrier cream Crea Commonly known as: non-specified Apply 1 application. topically 3 (three) times daily.   lidocaine 5 % Commonly known as: LIDODERM Place 1 patch onto the skin daily. Remove & Discard patch within 12 hours or as directed by MD   LORazepam 2 MG/ML concentrated solution Commonly known as: ATIVAN Place 0.5 mLs (1 mg total) under the tongue every 4 (four) hours as needed for up  to 1 day for anxiety or seizure. Replaces: Ativan 0.5 MG tablet   morphine CONCENTRATE 10 MG/0.5ML Soln concentrated solution Place 0.25 mLs (5 mg total) under the tongue every 2 (two) hours as needed for up to 1 day for moderate pain or severe pain (or dyspnea).   Trelegy Ellipta 100-62.5-25 MCG/ACT Aepb Generic drug: Fluticasone-Umeclidin-Vilant Inhale 1 puff into the lungs daily.        Allergies  Allergen Reactions   Depakote [Divalproex Sodium] Other (See Comments)   Penicillins    Sulfa Antibiotics Other (See Comments)    Consultations:    Procedures/Studies: No results found. (Echo, Carotid, EGD, Colonoscopy, ERCP)    Subjective: Pt is sleeping comfortably    Discharge Exam: Vitals:   05/10/23 0934 05/11/23 0808  BP: (!) 104/51 (!) 155/69  Pulse: 76 70  Resp: 16 16  Temp:  97.6 F (36.4 C)  SpO2: 98% 100%   Vitals:   05/10/23 0348 05/10/23 0927 05/10/23 0934 05/11/23 0808  BP: (!) 160/78 (!) 104/51 (!) 104/51 (!) 155/69  Pulse: 89 76 76 70  Resp: 18 16 16 16   Temp: 98.2 F (36.8 C)   97.6 F (36.4 C)  TempSrc: Oral     SpO2: 93% 98% 98% 100%    General: Pt is alert, awake, not in acute distress Cardiovascular: S1/S2 +, no rubs, no gallops Respiratory: decreased breath sounds b/l  Abdominal: Soft, NT, ND, bowel sounds + Extremities:  no cyanosis    The results of significant diagnostics from this hospitalization (including imaging, microbiology, ancillary and laboratory) are listed below for reference.     Microbiology: No results found for this or any previous visit (from the past 240 hour(s)).   Labs: BNP (last 3 results) Recent Labs    08/12/22 0225  BNP 38.4   Basic Metabolic Panel: Recent Labs  Lab 05/08/23 0930 05/09/23 0507  NA 135 137  K 4.8 3.3*  CL 103 111  CO2 18* 20*  GLUCOSE 114* 81  BUN 51* 28*  CREATININE 0.67 0.71  CALCIUM 8.1* 7.8*   Liver Function Tests: Recent Labs  Lab 05/08/23 0930  AST 22  ALT  11  ALKPHOS 40  BILITOT 1.2  PROT 5.4*  ALBUMIN 3.1*   No results for input(s): "LIPASE", "AMYLASE" in the last 168 hours. No results for input(s): "AMMONIA" in the last 168 hours. CBC: Recent Labs  Lab 05/08/23 0930 05/08/23 1519 05/08/23 2208 05/09/23 0507 05/09/23 1953 05/10/23 0611  WBC 10.6*  --   --  5.7  --  6.5  NEUTROABS 7.8*  --   --   --   --   --   HGB 8.1* 7.8* 6.7* 6.1* 9.4* 10.9*  HCT 24.5* 24.0* 20.2* 18.6* 27.6* 31.4*  MCV 91.8  --   --  90.7  --  88.0  PLT 206  --   --  155  --  163   Cardiac Enzymes: No results for input(s): "CKTOTAL", "CKMB", "  CKMBINDEX", "TROPONINI" in the last 168 hours. BNP: Invalid input(s): "POCBNP" CBG: No results for input(s): "GLUCAP" in the last 168 hours. D-Dimer No results for input(s): "DDIMER" in the last 72 hours. Hgb A1c No results for input(s): "HGBA1C" in the last 72 hours. Lipid Profile No results for input(s): "CHOL", "HDL", "LDLCALC", "TRIG", "CHOLHDL", "LDLDIRECT" in the last 72 hours. Thyroid function studies No results for input(s): "TSH", "T4TOTAL", "T3FREE", "THYROIDAB" in the last 72 hours.  Invalid input(s): "FREET3" Anemia work up Recent Labs    05/08/23 1729  FERRITIN 52  TIBC 283  IRON 81  RETICCTPCT 2.5   Urinalysis    Component Value Date/Time   COLORURINE YELLOW (A) 08/12/2022 0442   APPEARANCEUR CLEAR (A) 08/12/2022 0442   LABSPEC 1.024 08/12/2022 0442   PHURINE 7.0 08/12/2022 0442   GLUCOSEU NEGATIVE 08/12/2022 0442   HGBUR NEGATIVE 08/12/2022 0442   BILIRUBINUR NEGATIVE 08/12/2022 0442   KETONESUR NEGATIVE 08/12/2022 0442   PROTEINUR NEGATIVE 08/12/2022 0442   NITRITE NEGATIVE 08/12/2022 0442   LEUKOCYTESUR NEGATIVE 08/12/2022 0442   Sepsis Labs Recent Labs  Lab 05/08/23 0930 05/09/23 0507 05/10/23 0611  WBC 10.6* 5.7 6.5   Microbiology No results found for this or any previous visit (from the past 240 hour(s)).   Time coordinating discharge: Over 30  minutes  SIGNED:   Charise Killian, MD  [facility] 05/11/2023, 11:52 AM Pager   If 7PM-7AM, please contact night-coverage [email or web address]

## 2023-05-11 NOTE — NC FL2 (Signed)
[place] MEDICAID [id number] LEVEL OF CARE FORM     IDENTIFICATION  Patient Name: Madeline Hernandez Birthdate: 1932/12/18 Sex: female Admission Date (Current Location): 05/08/2023  [place] and [place] Number:  Chiropodist and Address:  [facility], [address] [place], [place] [address]      Provider Number: [id number]  Attending Physician Name and Address:  No att. providers found  Relative Name and Phone Number:  Anabiya, Beauford (Son)  [phone] [facility])    Current Level of Care: Hospital Recommended Level of Care: Other (Comment) (Memory care) Prior Approval Number:    Date Approved/Denied:   PASRR Number:    Discharge Plan: Other (Comment) (memory care)    Current Diagnoses: Patient Active Problem List   Diagnosis Date Noted   Melena 05/08/2023   Acute blood loss anemia 05/08/2023   GI bleed 05/08/2023   Head injury 08/13/2022   Subdural hematoma (HCC) 08/13/2022   SDH (subdural hematoma) (HCC) 08/12/2022   Rib fractures 08/12/2022   Fall 08/12/2022   Lumbar compression fracture (HCC) 08/12/2022   Dementia with behavioral disturbance (HCC) 08/12/2022   History of CVA (cerebrovascular accident) 08/12/2022   Essential hypertension 08/12/2022   Hypothyroidism 08/12/2022   Hyperlipidemia 08/12/2022    Orientation RESPIRATION BLADDER Height & Weight     Self  Normal Incontinent Weight:   Height:     BEHAVIORAL SYMPTOMS/MOOD NEUROLOGICAL BOWEL NUTRITION STATUS  Other (Comment)  (n/a) Incontinent  (NPO)  AMBULATORY STATUS COMMUNICATION OF NEEDS Skin   Total Care Non-Verbally Normal                       Personal Care Assistance Level of Assistance  Total care       Total Care Assistance: Maximum assistance   Functional Limitations Info             SPECIAL CARE FACTORS FREQUENCY                       Contractures Contractures Info: Present    Additional Factors Info  Code Status Code Status Info:  DNR Allergies Info: Depakote (Divalproex Sodium), Penicillins, Sulfa Antibiotics           Current Medications (05/11/2023):  This is the current hospital active medication list AKE these medications     albuterol 108 (90 Base) MCG/ACT inhaler Commonly known as: VENTOLIN HFA Inhale 2 puffs into the lungs every 6 (six) hours as needed for wheezing or shortness of breath.    barrier cream Crea Commonly known as: non-specified Apply 1 application. topically 3 (three) times daily.    lidocaine 5 % Commonly known as: LIDODERM Place 1 patch onto the skin daily. Remove & Discard patch within 12 hours or as directed by MD    LORazepam 2 MG/ML concentrated solution Commonly known as: ATIVAN Place 0.5 mLs (1 mg total) under the tongue every 4 (four) hours as needed for up to 1 day for anxiety or seizure. Replaces: Ativan 0.5 MG tablet    morphine CONCENTRATE 10 MG/0.5ML Soln concentrated solution Place 0.25 mLs (5 mg total) under the tongue every 2 (two) hours as needed for up to 1 day for moderate pain or severe pain (or dyspnea).    Trelegy Ellipta 100-62.5-25 MCG/ACT Aepb Generic drug: Fluticasone-Umeclidin-Vilant Inhale 1 puff into the lungs daily.     Discharge Medications: Please see discharge summary for a list of discharge medications.  Relevant Imaging Results:  Relevant Lab  Results:   Additional Information SSN# [id number]  Truddie Hidden, RN

## 2023-05-11 NOTE — Progress Notes (Signed)
Nutrition Brief Note  Chart reviewed. Pt now transitioning to comfort care.  No further nutrition interventions planned at this time.  Please re-consult as needed.   Levada Schilling, RD, LDN, CDCES Registered Dietitian II Certified Diabetes Care and Education Specialist Please refer to [facility] for RD and/or RD on-call/weekend/after hours pager

## 2023-05-11 NOTE — NC FL2 (Signed)
[place] MEDICAID FL2 LEVEL OF CARE FORM     IDENTIFICATION  Patient Name: Madeline Hernandez Birthdate: [birth year] Sex: female Admission Date (Current Location): 05/08/2023  [facility] and [place] Number:  Chiropodist and Address:  [facility], [address], [place], [place] [address]      Provider Number: [id number]  Attending Physician Name and Address:  Charise Killian, MD  Relative Name and Phone Number:  Oliviarose, Barach (Son)  [phone] [facility])    Current Level of Care: Hospital Recommended Level of Care: Assisted Living Facility Prior Approval Number:    Date Approved/Denied:   PASRR Number:    Discharge Plan: Other (Comment) (ALF)    Current Diagnoses: Patient Active Problem List   Diagnosis Date Noted   Melena 05/08/2023   Acute blood loss anemia 05/08/2023   GI bleed 05/08/2023   Head injury 08/13/2022   Subdural hematoma (HCC) 08/13/2022   SDH (subdural hematoma) (HCC) 08/12/2022   Rib fractures 08/12/2022   Fall 08/12/2022   Lumbar compression fracture (HCC) 08/12/2022   Dementia with behavioral disturbance (HCC) 08/12/2022   History of CVA (cerebrovascular accident) 08/12/2022   Essential hypertension 08/12/2022   Hypothyroidism 08/12/2022   Hyperlipidemia 08/12/2022    Orientation RESPIRATION BLADDER Height & Weight     Self  Normal Incontinent Weight:   Height:     BEHAVIORAL SYMPTOMS/MOOD NEUROLOGICAL BOWEL NUTRITION STATUS  Other (Comment) (n/a)  (n/a) Incontinent Diet (NPO)  AMBULATORY STATUS COMMUNICATION OF NEEDS Skin   Total Care Non-Verbally                         Personal Care Assistance Level of Assistance  Total care           Functional Limitations Info             SPECIAL CARE FACTORS FREQUENCY                       Contractures Contractures Info: Present    Additional Factors Info  Code Status, Allergies Code Status Info: DNR Allergies Info: Depakote (Divalproex  Sodium), Penicillins, Sulfa Antibiotics           Current Medications (05/11/2023):  This is the current hospital active medication list TAKE these medications     albuterol 108 (90 Base) MCG/ACT inhaler Commonly known as: VENTOLIN HFA Inhale 2 puffs into the lungs every 6 (six) hours as needed for wheezing or shortness of breath.    barrier cream Crea Commonly known as: non-specified Apply 1 application. topically 3 (three) times daily.    lidocaine 5 % Commonly known as: LIDODERM Place 1 patch onto the skin daily. Remove & Discard patch within 12 hours or as directed by MD    LORazepam 2 MG/ML concentrated solution Commonly known as: ATIVAN Place 0.5 mLs (1 mg total) under the tongue every 4 (four) hours as needed for up to 1 day for anxiety or seizure. Replaces: Ativan 0.5 MG tablet    morphine CONCENTRATE 10 MG/0.5ML Soln concentrated solution Place 0.25 mLs (5 mg total) under the tongue every 2 (two) hours as needed for up to 1 day for moderate pain or severe pain (or dyspnea).    Trelegy Ellipta 100-62.5-25 MCG/ACT Aepb Generic drug: Fluticasone-Umeclidin-Vilant Inhale 1 puff into the lungs daily.     Discharge Medications: Please see discharge summary for a list of discharge medications.  Relevant Imaging Results:  Relevant Lab Results:  Additional Information SSN# [id number]  Truddie Hidden, RN

## 2023-05-11 NOTE — Care Management Important Message (Signed)
Important Message  Patient Details  Name: [name] MRN: [id number] Date of Birth: Nov 30, 1932   Medicare Important Message Given:  Other (see comment)  On comfort care measures.  Medicare IM withheld at this time out of respect for patient and family.   [name] 05/11/2023, 9:31 AM

## 2023-05-11 NOTE — TOC Progression Note (Signed)
Transition of Care [facility]) - Progression Note    Patient Details  Name: Madeline Hernandez MRN: [id number] Date of Birth: [birth year]  [facility]) CM/SW Contact  Truddie Hidden, RN Phone Number: 05/11/2023, 10:58 AM  Clinical Narrative:    Attempt to reach Selena Batten, nurse at [facility] @ [phone]. No answer. Left a message.         Expected Discharge Plan and Services                                               Social Determinants of Health (SDOH) Interventions SDOH Screenings   Food Insecurity: No Food Insecurity (08/12/2022)  Housing: Low Risk  (08/12/2022)  Transportation Needs: No Transportation Needs (08/12/2022)  Utilities: Not At Risk (08/12/2022)  Tobacco Use: Low Risk  (08/12/2022)    Readmission Risk Interventions     No data to display

## 2023-06-19 DEATH — deceased
